# Patient Record
Sex: Female | Born: 1935 | Race: White | Hispanic: No | Marital: Married | Smoking: Never smoker
Health system: Southern US, Community
[De-identification: ages and names within clinical notes are randomized; demographics above are authoritative.]

## PROBLEM LIST (undated history)

## (undated) DIAGNOSIS — E039 Hypothyroidism, unspecified: Secondary | ICD-10-CM

## (undated) DIAGNOSIS — I10 Essential (primary) hypertension: Secondary | ICD-10-CM

## (undated) DIAGNOSIS — R569 Unspecified convulsions: Secondary | ICD-10-CM

## (undated) DIAGNOSIS — F32A Depression, unspecified: Secondary | ICD-10-CM

## (undated) DIAGNOSIS — I2510 Atherosclerotic heart disease of native coronary artery without angina pectoris: Secondary | ICD-10-CM

## (undated) DIAGNOSIS — M1990 Unspecified osteoarthritis, unspecified site: Secondary | ICD-10-CM

## (undated) DIAGNOSIS — F329 Major depressive disorder, single episode, unspecified: Secondary | ICD-10-CM

## (undated) DIAGNOSIS — I639 Cerebral infarction, unspecified: Secondary | ICD-10-CM

## (undated) HISTORY — PX: APPENDECTOMY: SHX54

## (undated) HISTORY — PX: ABDOMINAL HYSTERECTOMY: SHX81

## (undated) HISTORY — PX: CARDIAC SURGERY: SHX584

---

## 2001-06-28 ENCOUNTER — Encounter: Payer: Self-pay | Admitting: Thoracic Surgery (Cardiothoracic Vascular Surgery)

## 2001-06-28 ENCOUNTER — Inpatient Hospital Stay (HOSPITAL_COMMUNITY)
Admission: EM | Admit: 2001-06-28 | Discharge: 2001-07-04 | Payer: Self-pay | Admitting: Thoracic Surgery (Cardiothoracic Vascular Surgery)

## 2001-06-29 ENCOUNTER — Encounter: Payer: Self-pay | Admitting: Thoracic Surgery (Cardiothoracic Vascular Surgery)

## 2001-06-30 ENCOUNTER — Encounter: Payer: Self-pay | Admitting: Thoracic Surgery (Cardiothoracic Vascular Surgery)

## 2001-07-01 ENCOUNTER — Encounter: Payer: Self-pay | Admitting: Thoracic Surgery (Cardiothoracic Vascular Surgery)

## 2001-07-02 ENCOUNTER — Encounter: Payer: Self-pay | Admitting: Thoracic Surgery (Cardiothoracic Vascular Surgery)

## 2001-07-22 ENCOUNTER — Encounter: Payer: Self-pay | Admitting: Thoracic Surgery (Cardiothoracic Vascular Surgery)

## 2001-07-22 ENCOUNTER — Encounter
Admission: RE | Admit: 2001-07-22 | Discharge: 2001-07-22 | Payer: Self-pay | Admitting: Thoracic Surgery (Cardiothoracic Vascular Surgery)

## 2001-08-09 ENCOUNTER — Encounter: Payer: Self-pay | Admitting: Cardiothoracic Surgery

## 2001-08-09 ENCOUNTER — Encounter: Admission: RE | Admit: 2001-08-09 | Discharge: 2001-08-09 | Payer: Self-pay | Admitting: Cardiothoracic Surgery

## 2001-08-19 ENCOUNTER — Encounter
Admission: RE | Admit: 2001-08-19 | Discharge: 2001-08-19 | Payer: Self-pay | Admitting: Thoracic Surgery (Cardiothoracic Vascular Surgery)

## 2001-08-19 ENCOUNTER — Encounter: Payer: Self-pay | Admitting: Thoracic Surgery (Cardiothoracic Vascular Surgery)

## 2001-09-02 ENCOUNTER — Encounter
Admission: RE | Admit: 2001-09-02 | Discharge: 2001-09-02 | Payer: Self-pay | Admitting: Thoracic Surgery (Cardiothoracic Vascular Surgery)

## 2001-09-02 ENCOUNTER — Encounter: Payer: Self-pay | Admitting: Thoracic Surgery (Cardiothoracic Vascular Surgery)

## 2003-11-27 ENCOUNTER — Ambulatory Visit: Payer: Self-pay | Admitting: Family Medicine

## 2004-01-17 HISTORY — PX: CORONARY ARTERY BYPASS GRAFT: SHX141

## 2004-10-16 ENCOUNTER — Emergency Department: Payer: Self-pay | Admitting: Emergency Medicine

## 2005-02-18 ENCOUNTER — Emergency Department: Payer: Self-pay | Admitting: Emergency Medicine

## 2006-03-13 ENCOUNTER — Ambulatory Visit: Payer: Self-pay | Admitting: Family Medicine

## 2006-10-19 ENCOUNTER — Ambulatory Visit: Payer: Self-pay | Admitting: Cardiology

## 2007-06-11 ENCOUNTER — Ambulatory Visit: Payer: Self-pay | Admitting: Family Medicine

## 2007-07-05 ENCOUNTER — Inpatient Hospital Stay: Payer: Self-pay | Admitting: Internal Medicine

## 2007-07-05 ENCOUNTER — Other Ambulatory Visit: Payer: Self-pay

## 2007-07-15 ENCOUNTER — Encounter: Payer: Self-pay | Admitting: Internal Medicine

## 2007-07-23 ENCOUNTER — Encounter: Payer: Self-pay | Admitting: Internal Medicine

## 2007-08-08 ENCOUNTER — Inpatient Hospital Stay: Payer: Self-pay | Admitting: Unknown Physician Specialty

## 2007-08-08 ENCOUNTER — Ambulatory Visit: Payer: Self-pay | Admitting: Internal Medicine

## 2007-08-09 ENCOUNTER — Other Ambulatory Visit: Payer: Self-pay

## 2007-08-17 ENCOUNTER — Encounter: Payer: Self-pay | Admitting: Internal Medicine

## 2010-07-01 ENCOUNTER — Emergency Department: Payer: Self-pay | Admitting: Emergency Medicine

## 2010-08-10 ENCOUNTER — Ambulatory Visit: Payer: Self-pay | Admitting: General Surgery

## 2010-08-26 ENCOUNTER — Emergency Department: Payer: Self-pay | Admitting: *Deleted

## 2010-10-09 ENCOUNTER — Inpatient Hospital Stay: Payer: Self-pay | Admitting: Internal Medicine

## 2010-11-17 ENCOUNTER — Ambulatory Visit: Payer: Self-pay | Admitting: Internal Medicine

## 2010-11-18 ENCOUNTER — Inpatient Hospital Stay: Payer: Self-pay | Admitting: Internal Medicine

## 2010-11-19 DIAGNOSIS — R072 Precordial pain: Secondary | ICD-10-CM

## 2010-11-25 ENCOUNTER — Encounter: Payer: Self-pay | Admitting: Internal Medicine

## 2010-12-17 ENCOUNTER — Encounter: Payer: Self-pay | Admitting: Internal Medicine

## 2010-12-17 ENCOUNTER — Ambulatory Visit: Payer: Self-pay | Admitting: Internal Medicine

## 2011-02-23 ENCOUNTER — Encounter (HOSPITAL_COMMUNITY)
Admission: EM | Disposition: A | Discharge status: Skilled Nursing Facility | Payer: Self-pay | Source: Home / Self Care | Attending: Internal Medicine

## 2011-02-23 ENCOUNTER — Other Ambulatory Visit: Payer: Self-pay

## 2011-02-23 ENCOUNTER — Inpatient Hospital Stay (HOSPITAL_COMMUNITY): Payer: Medicare Other

## 2011-02-23 ENCOUNTER — Encounter (HOSPITAL_COMMUNITY): Payer: Self-pay | Admitting: Anesthesiology

## 2011-02-23 ENCOUNTER — Encounter (HOSPITAL_COMMUNITY): Payer: Self-pay | Admitting: *Deleted

## 2011-02-23 ENCOUNTER — Emergency Department (HOSPITAL_COMMUNITY): Payer: Medicare Other

## 2011-02-23 ENCOUNTER — Inpatient Hospital Stay (HOSPITAL_COMMUNITY)
Admission: EM | Admit: 2011-02-23 | Discharge: 2011-02-28 | DRG: 480 | Disposition: A | Discharge status: Skilled Nursing Facility | Payer: Medicare Other | Attending: Internal Medicine | Admitting: Internal Medicine

## 2011-02-23 ENCOUNTER — Inpatient Hospital Stay (HOSPITAL_COMMUNITY): Payer: Medicare Other | Admitting: Anesthesiology

## 2011-02-23 DIAGNOSIS — D62 Acute posthemorrhagic anemia: Secondary | ICD-10-CM | POA: Diagnosis not present

## 2011-02-23 DIAGNOSIS — IMO0001 Reserved for inherently not codable concepts without codable children: Secondary | ICD-10-CM | POA: Diagnosis present

## 2011-02-23 DIAGNOSIS — E039 Hypothyroidism, unspecified: Secondary | ICD-10-CM | POA: Diagnosis present

## 2011-02-23 DIAGNOSIS — E871 Hypo-osmolality and hyponatremia: Secondary | ICD-10-CM | POA: Diagnosis not present

## 2011-02-23 DIAGNOSIS — W19XXXA Unspecified fall, initial encounter: Secondary | ICD-10-CM | POA: Diagnosis present

## 2011-02-23 DIAGNOSIS — W010XXA Fall on same level from slipping, tripping and stumbling without subsequent striking against object, initial encounter: Secondary | ICD-10-CM | POA: Diagnosis present

## 2011-02-23 DIAGNOSIS — I2510 Atherosclerotic heart disease of native coronary artery without angina pectoris: Secondary | ICD-10-CM

## 2011-02-23 DIAGNOSIS — G40909 Epilepsy, unspecified, not intractable, without status epilepticus: Secondary | ICD-10-CM | POA: Diagnosis present

## 2011-02-23 DIAGNOSIS — Y92009 Unspecified place in unspecified non-institutional (private) residence as the place of occurrence of the external cause: Secondary | ICD-10-CM

## 2011-02-23 DIAGNOSIS — R509 Fever, unspecified: Secondary | ICD-10-CM | POA: Diagnosis present

## 2011-02-23 DIAGNOSIS — J189 Pneumonia, unspecified organism: Secondary | ICD-10-CM | POA: Diagnosis not present

## 2011-02-23 DIAGNOSIS — S7291XA Unspecified fracture of right femur, initial encounter for closed fracture: Secondary | ICD-10-CM

## 2011-02-23 DIAGNOSIS — F329 Major depressive disorder, single episode, unspecified: Secondary | ICD-10-CM | POA: Diagnosis present

## 2011-02-23 DIAGNOSIS — S72143A Displaced intertrochanteric fracture of unspecified femur, initial encounter for closed fracture: Principal | ICD-10-CM | POA: Diagnosis present

## 2011-02-23 DIAGNOSIS — I10 Essential (primary) hypertension: Secondary | ICD-10-CM | POA: Diagnosis present

## 2011-02-23 DIAGNOSIS — S72001A Fracture of unspecified part of neck of right femur, initial encounter for closed fracture: Secondary | ICD-10-CM

## 2011-02-23 DIAGNOSIS — E1165 Type 2 diabetes mellitus with hyperglycemia: Secondary | ICD-10-CM | POA: Diagnosis present

## 2011-02-23 DIAGNOSIS — Z8673 Personal history of transient ischemic attack (TIA), and cerebral infarction without residual deficits: Secondary | ICD-10-CM

## 2011-02-23 DIAGNOSIS — F3289 Other specified depressive episodes: Secondary | ICD-10-CM | POA: Diagnosis present

## 2011-02-23 DIAGNOSIS — E860 Dehydration: Secondary | ICD-10-CM | POA: Diagnosis present

## 2011-02-23 DIAGNOSIS — D72829 Elevated white blood cell count, unspecified: Secondary | ICD-10-CM | POA: Diagnosis present

## 2011-02-23 DIAGNOSIS — Z951 Presence of aortocoronary bypass graft: Secondary | ICD-10-CM

## 2011-02-23 DIAGNOSIS — Z66 Do not resuscitate: Secondary | ICD-10-CM | POA: Diagnosis present

## 2011-02-23 HISTORY — DX: Major depressive disorder, single episode, unspecified: F32.9

## 2011-02-23 HISTORY — DX: Unspecified osteoarthritis, unspecified site: M19.90

## 2011-02-23 HISTORY — DX: Depression, unspecified: F32.A

## 2011-02-23 HISTORY — DX: Essential (primary) hypertension: I10

## 2011-02-23 HISTORY — DX: Atherosclerotic heart disease of native coronary artery without angina pectoris: I25.10

## 2011-02-23 HISTORY — DX: Unspecified convulsions: R56.9

## 2011-02-23 HISTORY — DX: Cerebral infarction, unspecified: I63.9

## 2011-02-23 HISTORY — DX: Hypothyroidism, unspecified: E03.9

## 2011-02-23 HISTORY — PX: FEMUR IM NAIL: SHX1597

## 2011-02-23 LAB — URINALYSIS, ROUTINE W REFLEX MICROSCOPIC
Bilirubin Urine: NEGATIVE
Glucose, UA: >1000 mg/dL — AB
Hgb urine dipstick: NEGATIVE
Ketones, ur: NEGATIVE mg/dL
Specific Gravity, Urine: 1.017 (ref 1.005–1.030)
pH: 6.5 (ref 5.0–8.0)

## 2011-02-23 LAB — COMPREHENSIVE METABOLIC PANEL
ALT: 20 U/L (ref 0–35)
AST: 18 U/L (ref 0–37)
Albumin: 3.5 g/dL (ref 3.5–5.2)
CO2: 25 mEq/L (ref 19–32)
Calcium: 9.3 mg/dL (ref 8.4–10.5)
Chloride: 94 mEq/L — ABNORMAL LOW (ref 96–112)
GFR calc non Af Amer: 83 mL/min — ABNORMAL LOW (ref >90)
Sodium: 131 mEq/L — ABNORMAL LOW (ref 135–145)

## 2011-02-23 LAB — CBC
Platelets: 152 10*3/uL (ref 150–400)
RDW: 15.9 % — ABNORMAL HIGH (ref 11.5–15.5)
WBC: 15 10*3/uL — ABNORMAL HIGH (ref 4.0–10.5)

## 2011-02-23 LAB — DIFFERENTIAL
Basophils Absolute: 0 10*3/uL (ref 0.0–0.1)
Basophils Relative: 0 % (ref 0–1)
Eosinophils Relative: 1 % (ref 0–5)
Lymphocytes Relative: 6 % — ABNORMAL LOW (ref 12–46)
Neutro Abs: 13.2 10*3/uL — ABNORMAL HIGH (ref 1.7–7.7)

## 2011-02-23 LAB — GLUCOSE, CAPILLARY: Glucose-Capillary: 174 mg/dL — ABNORMAL HIGH (ref 70–99)

## 2011-02-23 LAB — PROTIME-INR: Prothrombin Time: 15.2 seconds (ref 11.6–15.2)

## 2011-02-23 LAB — PHENYTOIN LEVEL, TOTAL: Phenytoin Lvl: 15.5 ug/mL (ref 10.0–20.0)

## 2011-02-23 SURGERY — INSERTION, INTRAMEDULLARY ROD, FEMUR
Anesthesia: General | Site: Hip | Laterality: Right | Wound class: Clean

## 2011-02-23 MED ORDER — MENTHOL 3 MG MT LOZG
1.0000 | LOZENGE | OROMUCOSAL | Status: DC | PRN
  Filled 2011-02-23: qty 9

## 2011-02-23 MED ORDER — ISOSORBIDE DINITRATE 30 MG PO TABS
30.0000 mg | ORAL_TABLET | Freq: Four times a day (QID) | ORAL | Status: DC
  Administered 2011-02-23 – 2011-02-26 (×11): 30 mg via ORAL
  Filled 2011-02-23 (×14): qty 1

## 2011-02-23 MED ORDER — PROPOFOL 10 MG/ML IV EMUL
INTRAVENOUS | Status: DC | PRN
  Administered 2011-02-23: 120 mg via INTRAVENOUS

## 2011-02-23 MED ORDER — ONDANSETRON HCL 4 MG/2ML IJ SOLN
INTRAMUSCULAR | Status: DC | PRN
  Administered 2011-02-23: 2 mg via INTRAVENOUS

## 2011-02-23 MED ORDER — AMLODIPINE BESYLATE 2.5 MG PO TABS
2.5000 mg | ORAL_TABLET | Freq: Every day | ORAL | Status: DC
  Administered 2011-02-23 – 2011-02-26 (×4): 2.5 mg via ORAL
  Filled 2011-02-23 (×4): qty 1

## 2011-02-23 MED ORDER — SODIUM CHLORIDE 0.9 % IV SOLN: INTRAVENOUS | Status: DC

## 2011-02-23 MED ORDER — ACETAMINOPHEN 650 MG RE SUPP: 650.0000 mg | Freq: Four times a day (QID) | RECTAL | Status: DC | PRN

## 2011-02-23 MED ORDER — HEPARIN SODIUM (PORCINE) 5000 UNIT/ML IJ SOLN
5000.0000 [IU] | Freq: Three times a day (TID) | INTRAMUSCULAR | Status: DC
  Administered 2011-02-23 – 2011-02-26 (×9): 5000 [IU] via SUBCUTANEOUS
  Filled 2011-02-23 (×14): qty 1

## 2011-02-23 MED ORDER — HYDROMORPHONE HCL PF 1 MG/ML IJ SOLN
1.0000 mg | INTRAMUSCULAR | Status: DC | PRN
  Administered 2011-02-23 (×2): 1 mg via INTRAVENOUS

## 2011-02-23 MED ORDER — FENTANYL CITRATE 0.05 MG/ML IJ SOLN
INTRAMUSCULAR | Status: AC
  Filled 2011-02-23: qty 2

## 2011-02-23 MED ORDER — CARVEDILOL 12.5 MG PO TABS
12.5000 mg | ORAL_TABLET | Freq: Two times a day (BID) | ORAL | Status: DC
  Administered 2011-02-24 – 2011-02-28 (×10): 12.5 mg via ORAL
  Filled 2011-02-23 (×13): qty 1

## 2011-02-23 MED ORDER — INSULIN ASPART 100 UNIT/ML ~~LOC~~ SOLN
0.0000 [IU] | Freq: Three times a day (TID) | SUBCUTANEOUS | Status: DC
  Administered 2011-02-24: 3 [IU] via SUBCUTANEOUS
  Administered 2011-02-24 – 2011-02-25 (×3): 5 [IU] via SUBCUTANEOUS
  Administered 2011-02-25 (×2): 8 [IU] via SUBCUTANEOUS
  Administered 2011-02-26 (×3): 5 [IU] via SUBCUTANEOUS
  Administered 2011-02-27 – 2011-02-28 (×3): 3 [IU] via SUBCUTANEOUS
  Administered 2011-02-28: 8 [IU] via SUBCUTANEOUS
  Administered 2011-02-28: 3 [IU] via SUBCUTANEOUS
  Filled 2011-02-23: qty 3

## 2011-02-23 MED ORDER — HYDROCODONE-ACETAMINOPHEN 5-325 MG PO TABS
1.0000 | ORAL_TABLET | ORAL | Status: DC | PRN
  Administered 2011-02-24: 1 via ORAL
  Filled 2011-02-23: qty 1

## 2011-02-23 MED ORDER — LACTATED RINGERS IV SOLN
INTRAVENOUS | Status: DC
  Administered 2011-02-25: 16:00:00 via INTRAVENOUS

## 2011-02-23 MED ORDER — CEFAZOLIN SODIUM 1-5 GM-% IV SOLN
INTRAVENOUS | Status: DC | PRN
  Administered 2011-02-23: 1 g via INTRAVENOUS

## 2011-02-23 MED ORDER — LACTATED RINGERS IV SOLN
INTRAVENOUS | Status: DC | PRN
  Administered 2011-02-23: 19:00:00 via INTRAVENOUS

## 2011-02-23 MED ORDER — ONDANSETRON HCL 4 MG/2ML IJ SOLN: 4.0000 mg | Freq: Four times a day (QID) | INTRAMUSCULAR | Status: DC | PRN

## 2011-02-23 MED ORDER — FENTANYL CITRATE 0.05 MG/ML IJ SOLN
50.0000 ug | Freq: Once | INTRAMUSCULAR | Status: AC
  Administered 2011-02-23: 50 ug via INTRAVENOUS
  Filled 2011-02-23: qty 2

## 2011-02-23 MED ORDER — GLYCOPYRROLATE 0.2 MG/ML IJ SOLN
INTRAMUSCULAR | Status: DC | PRN
  Administered 2011-02-23: .4 mg via INTRAVENOUS

## 2011-02-23 MED ORDER — HETASTARCH-ELECTROLYTES 6 % IV SOLN
INTRAVENOUS | Status: DC | PRN
  Administered 2011-02-23: 20:00:00 via INTRAVENOUS

## 2011-02-23 MED ORDER — EPHEDRINE SULFATE 50 MG/ML IJ SOLN
INTRAMUSCULAR | Status: DC | PRN
  Administered 2011-02-23: 10 mg via INTRAVENOUS
  Administered 2011-02-23: 5 mg via INTRAVENOUS

## 2011-02-23 MED ORDER — CEFAZOLIN SODIUM 1-5 GM-% IV SOLN
1.0000 g | Freq: Three times a day (TID) | INTRAVENOUS | Status: AC
  Administered 2011-02-24 (×3): 1 g via INTRAVENOUS
  Filled 2011-02-23 (×3): qty 50

## 2011-02-23 MED ORDER — NEOSTIGMINE METHYLSULFATE 1 MG/ML IJ SOLN
INTRAMUSCULAR | Status: DC | PRN
  Administered 2011-02-23: 2 mg via INTRAVENOUS

## 2011-02-23 MED ORDER — PROMETHAZINE HCL 25 MG/ML IJ SOLN: 6.2500 mg | INTRAMUSCULAR | Status: DC | PRN

## 2011-02-23 MED ORDER — ONDANSETRON HCL 4 MG/2ML IJ SOLN: 4.0000 mg | Freq: Three times a day (TID) | INTRAMUSCULAR | Status: DC | PRN

## 2011-02-23 MED ORDER — ONDANSETRON HCL 4 MG PO TABS: 4.0000 mg | ORAL_TABLET | Freq: Four times a day (QID) | ORAL | Status: DC | PRN

## 2011-02-23 MED ORDER — METHOCARBAMOL 100 MG/ML IJ SOLN
500.0000 mg | Freq: Four times a day (QID) | INTRAVENOUS | Status: DC | PRN
  Administered 2011-02-23: 500 mg via INTRAVENOUS
  Filled 2011-02-23 (×2): qty 5

## 2011-02-23 MED ORDER — SODIUM CHLORIDE 0.9 % IV SOLN
INTRAVENOUS | Status: AC
  Administered 2011-02-23: 75 mL/h via INTRAVENOUS

## 2011-02-23 MED ORDER — ONDANSETRON HCL 4 MG/2ML IJ SOLN
INTRAMUSCULAR | Status: AC
  Filled 2011-02-23: qty 2

## 2011-02-23 MED ORDER — PHENYTOIN SODIUM EXTENDED 100 MG PO CAPS
100.0000 mg | ORAL_CAPSULE | Freq: Three times a day (TID) | ORAL | Status: DC
  Administered 2011-02-23 – 2011-02-28 (×15): 100 mg via ORAL
  Filled 2011-02-23 (×19): qty 1

## 2011-02-23 MED ORDER — INSULIN GLARGINE 100 UNIT/ML ~~LOC~~ SOLN
20.0000 [IU] | Freq: Every day | SUBCUTANEOUS | Status: DC
  Administered 2011-02-23 – 2011-02-24 (×2): 20 [IU] via SUBCUTANEOUS
  Filled 2011-02-23: qty 3

## 2011-02-23 MED ORDER — CITALOPRAM HYDROBROMIDE 20 MG PO TABS
20.0000 mg | ORAL_TABLET | Freq: Every day | ORAL | Status: DC
  Administered 2011-02-24 – 2011-02-28 (×5): 20 mg via ORAL
  Filled 2011-02-23 (×7): qty 1

## 2011-02-23 MED ORDER — CISATRACURIUM BOLUS VIA INFUSION
INTRAVENOUS | Status: DC | PRN
  Administered 2011-02-23: 4 mg via INTRAVENOUS

## 2011-02-23 MED ORDER — ACETAMINOPHEN 325 MG PO TABS
650.0000 mg | ORAL_TABLET | Freq: Four times a day (QID) | ORAL | Status: DC | PRN
  Administered 2011-02-26 – 2011-02-28 (×5): 650 mg via ORAL
  Filled 2011-02-23 (×6): qty 2

## 2011-02-23 MED ORDER — SODIUM CHLORIDE 0.9 % IV SOLN
999.0000 mL | INTRAVENOUS | Status: DC
  Administered 2011-02-23: 999 mL via INTRAVENOUS
  Administered 2011-02-25: 1000 mL via INTRAVENOUS

## 2011-02-23 MED ORDER — SODIUM CHLORIDE 0.9 % IV SOLN
INTRAVENOUS | Status: DC | PRN
  Administered 2011-02-23 (×2): via INTRAVENOUS

## 2011-02-23 MED ORDER — METHOCARBAMOL 500 MG PO TABS
500.0000 mg | ORAL_TABLET | Freq: Four times a day (QID) | ORAL | Status: DC | PRN
  Administered 2011-02-26 – 2011-02-28 (×5): 500 mg via ORAL
  Filled 2011-02-23 (×5): qty 1

## 2011-02-23 MED ORDER — METOCLOPRAMIDE HCL 5 MG/ML IJ SOLN: 5.0000 mg | Freq: Three times a day (TID) | INTRAMUSCULAR | Status: DC | PRN

## 2011-02-23 MED ORDER — WARFARIN VIDEO
Freq: Once | Status: AC
  Administered 2011-02-24: 17:00:00

## 2011-02-23 MED ORDER — WARFARIN SODIUM 4 MG PO TABS
4.0000 mg | ORAL_TABLET | ORAL | Status: AC
  Administered 2011-02-23: 4 mg via ORAL
  Filled 2011-02-23: qty 1

## 2011-02-23 MED ORDER — BRIMONIDINE TARTRATE 0.15 % OP SOLN
1.0000 [drp] | Freq: Two times a day (BID) | OPHTHALMIC | Status: DC
  Administered 2011-02-23 – 2011-02-28 (×10): 1 [drp] via OPHTHALMIC
  Filled 2011-02-23: qty 5

## 2011-02-23 MED ORDER — PANTOPRAZOLE SODIUM 40 MG PO TBEC
80.0000 mg | DELAYED_RELEASE_TABLET | Freq: Every day | ORAL | Status: DC
  Administered 2011-02-24 – 2011-02-28 (×5): 80 mg via ORAL
  Filled 2011-02-23 (×6): qty 2
  Filled 2011-02-23 (×2): qty 1

## 2011-02-23 MED ORDER — SUCCINYLCHOLINE CHLORIDE 20 MG/ML IJ SOLN
INTRAMUSCULAR | Status: DC | PRN
  Administered 2011-02-23: 100 mg via INTRAVENOUS

## 2011-02-23 MED ORDER — PHENOL 1.4 % MT LIQD
1.0000 | OROMUCOSAL | Status: DC | PRN
  Filled 2011-02-23: qty 177

## 2011-02-23 MED ORDER — METOCLOPRAMIDE HCL 10 MG PO TABS: 5.0000 mg | ORAL_TABLET | Freq: Three times a day (TID) | ORAL | Status: DC | PRN

## 2011-02-23 MED ORDER — HYDROMORPHONE HCL PF 1 MG/ML IJ SOLN
INTRAMUSCULAR | Status: AC
  Administered 2011-02-23: 1 mg via INTRAVENOUS
  Filled 2011-02-23: qty 1

## 2011-02-23 MED ORDER — FENTANYL CITRATE 0.05 MG/ML IJ SOLN: 50.0000 ug | INTRAMUSCULAR | Status: DC | PRN

## 2011-02-23 MED ORDER — MEPERIDINE HCL 50 MG/ML IJ SOLN: 6.2500 mg | INTRAMUSCULAR | Status: DC | PRN

## 2011-02-23 MED ORDER — SODIUM CHLORIDE 0.9 % IV SOLN: 999.0000 mL | INTRAVENOUS | Status: DC

## 2011-02-23 MED ORDER — LIDOCAINE HCL (CARDIAC) 20 MG/ML IV SOLN
INTRAVENOUS | Status: DC | PRN
  Administered 2011-02-23: 40 mg via INTRAVENOUS

## 2011-02-23 MED ORDER — FENTANYL CITRATE 0.05 MG/ML IJ SOLN
INTRAMUSCULAR | Status: DC | PRN
  Administered 2011-02-23 (×2): 25 ug via INTRAVENOUS
  Administered 2011-02-23 (×3): 50 ug via INTRAVENOUS

## 2011-02-23 MED ORDER — FENTANYL CITRATE 0.05 MG/ML IJ SOLN
50.0000 ug | Freq: Once | INTRAMUSCULAR | Status: AC
  Administered 2011-02-23: 50 ug via INTRAVENOUS

## 2011-02-23 MED ORDER — LEVOTHYROXINE SODIUM 50 MCG PO TABS
50.0000 ug | ORAL_TABLET | Freq: Every day | ORAL | Status: DC
  Administered 2011-02-24 – 2011-02-28 (×5): 50 ug via ORAL
  Filled 2011-02-23 (×6): qty 1

## 2011-02-23 MED ORDER — COUMADIN BOOK
Freq: Once | Status: AC
  Administered 2011-02-24: 16:00:00
  Filled 2011-02-23 (×2): qty 1

## 2011-02-23 MED ORDER — HYDROMORPHONE HCL PF 1 MG/ML IJ SOLN
0.2500 mg | INTRAMUSCULAR | Status: DC | PRN
  Filled 2011-02-23: qty 1

## 2011-02-23 MED ORDER — 0.9 % SODIUM CHLORIDE (POUR BTL) OPTIME
TOPICAL | Status: DC | PRN
  Administered 2011-02-23: 1000 mL

## 2011-02-23 MED ORDER — BRIMONIDINE TARTRATE 0.1 % OP SOLN: 1.0000 [drp] | Freq: Two times a day (BID) | OPHTHALMIC | Status: DC

## 2011-02-23 MED ORDER — INSULIN ASPART 100 UNIT/ML ~~LOC~~ SOLN
0.0000 [IU] | Freq: Every day | SUBCUTANEOUS | Status: DC
  Administered 2011-02-24: 5 [IU] via SUBCUTANEOUS
  Administered 2011-02-25: 4 [IU] via SUBCUTANEOUS

## 2011-02-23 MED ORDER — SENNOSIDES-DOCUSATE SODIUM 8.6-50 MG PO TABS
1.0000 | ORAL_TABLET | Freq: Every evening | ORAL | Status: DC | PRN
  Administered 2011-02-26: 1 via ORAL
  Filled 2011-02-23: qty 1

## 2011-02-23 MED ORDER — ONDANSETRON HCL 4 MG/2ML IJ SOLN
4.0000 mg | Freq: Once | INTRAMUSCULAR | Status: AC
  Administered 2011-02-23: 4 mg via INTRAVENOUS

## 2011-02-23 SURGICAL SUPPLY — 44 items
BAG ZIPLOCK 12X15 (MISCELLANEOUS) IMPLANT
BANDAGE ELASTIC 6 VELCRO ST LF (GAUZE/BANDAGES/DRESSINGS) IMPLANT
BANDAGE GAUZE ELAST BULKY 4 IN (GAUZE/BANDAGES/DRESSINGS) ×2 IMPLANT
BLADE SURG 15 STRL LF DISP TIS (BLADE) ×1 IMPLANT
BLADE SURG 15 STRL SS (BLADE) ×1
BNDG COHESIVE 4X5 TAN STRL (GAUZE/BANDAGES/DRESSINGS) ×2 IMPLANT
CLOTH BEACON ORANGE TIMEOUT ST (SAFETY) ×2 IMPLANT
DRAPE STERI IOBAN 125X83 (DRAPES) ×2 IMPLANT
DRSG MEPILEX BORDER 4X4 (GAUZE/BANDAGES/DRESSINGS) ×6 IMPLANT
DRSG PAD ABDOMINAL 8X10 ST (GAUZE/BANDAGES/DRESSINGS) ×4 IMPLANT
DRSG TEGADERM 4X4.75 (GAUZE/BANDAGES/DRESSINGS) ×2 IMPLANT
DURAPREP 26ML APPLICATOR (WOUND CARE) ×2 IMPLANT
ELECT REM PT RETURN 9FT ADLT (ELECTROSURGICAL) ×2
ELECTRODE REM PT RTRN 9FT ADLT (ELECTROSURGICAL) ×1 IMPLANT
GAUZE XEROFORM 1X8 LF (GAUZE/BANDAGES/DRESSINGS) ×2 IMPLANT
GLOVE BIOGEL PI IND STRL 6 (GLOVE) ×1 IMPLANT
GLOVE BIOGEL PI INDICATOR 6 (GLOVE) ×1
GLOVE SURG ORTHO 8.0 STRL STRW (GLOVE) ×2 IMPLANT
GLOVE SURG SS PI 6.5 STRL IVOR (GLOVE) ×2 IMPLANT
GOWN STRL NON-REIN LRG LVL3 (GOWN DISPOSABLE) ×2 IMPLANT
GUIDE PIN 3.2 LONG (PIN) ×2 IMPLANT
GUIDE PIN 3.2MM (MISCELLANEOUS) ×1
GUIDE PIN ORTH 343X3.2XBRAD (MISCELLANEOUS) ×1 IMPLANT
HIP SCREW SET (Screw) ×2 IMPLANT
KIT BASIN OR (CUSTOM PROCEDURE TRAY) ×2 IMPLANT
NAIL IMHS 10X36 R (Nail) ×2 IMPLANT
NS IRRIG 1000ML POUR BTL (IV SOLUTION) ×2 IMPLANT
PACK GENERAL/GYN (CUSTOM PROCEDURE TRAY) ×2 IMPLANT
PADDING CAST COTTON 6X4 STRL (CAST SUPPLIES) ×4 IMPLANT
POSITIONER SURGICAL ARM (MISCELLANEOUS) ×2 IMPLANT
SCREW COMPRESSION (Screw) ×2 IMPLANT
SCREW LAG 95MM (Screw) ×1 IMPLANT
SCREW LAGSTD 95X21X12.7X9 (Screw) ×1 IMPLANT
SPONGE GAUZE 4X4 12PLY (GAUZE/BANDAGES/DRESSINGS) IMPLANT
SPONGE LAP 4X18 X RAY DECT (DISPOSABLE) IMPLANT
STAPLER VISISTAT (STAPLE) IMPLANT
STAPLER VISISTAT 35W (STAPLE) ×2 IMPLANT
SUT ETHILON 3 0 PS 1 (SUTURE) IMPLANT
SUT VIC AB 0 CT1 27 (SUTURE) ×2
SUT VIC AB 0 CT1 27XBRD ANTBC (SUTURE) ×2 IMPLANT
SUT VIC AB 2-0 CT1 27 (SUTURE) ×1
SUT VIC AB 2-0 CT1 TAPERPNT 27 (SUTURE) ×1 IMPLANT
TOWEL OR 17X26 10 PK STRL BLUE (TOWEL DISPOSABLE) ×6 IMPLANT
WATER STERILE IRR 1500ML POUR (IV SOLUTION) ×2 IMPLANT

## 2011-02-23 NOTE — ED Notes (Signed)
Patient transported to X-ray 

## 2011-02-23 NOTE — Brief Op Note (Signed)
02/23/2011  8:30 PM  PATIENT:  Courtney Pitts  78 y.o. female  PRE-OPERATIVE DIAGNOSIS:  Right intertrochantaric fracture  POST-OPERATIVE DIAGNOSIS:  right intertrochantaric fracture  PROCEDURE:  Procedure(s): INTRAMEDULLARY (IM) NAIL FEMORAL  SURGEON:  Surgeon(s): Cammy Copa, MD  ASSISTANT: none  ANESTHESIA:   general  EBL: 100 ml    Total I/O In: -  Out: 250 [Urine:50; Blood:200]  BLOOD ADMINISTERED: none  DRAINS: none   LOCAL MEDICATIONS USED:  none  SPECIMEN:  No Specimen  COUNTS:  YES  TOURNIQUET:  * No tourniquets in log *  DICTATION: .Other Dictation: Dictation Number [id number]  PLAN OF CARE: Admit to inpatient   PATIENT DISPOSITION:  PACU - hemodynamically stable

## 2011-02-23 NOTE — Anesthesia Postprocedure Evaluation (Signed)
  Anesthesia Post-op Note  Patient: Courtney Pitts  Procedure(s) Performed:  INTRAMEDULLARY (IM) NAIL FEMORAL  Patient Location: PACU  Anesthesia Type: General  Level of Consciousness: awake and alert   Airway and Oxygen Therapy: Patient Spontanous Breathing  Post-op Pain: mild  Post-op Assessment: Post-op Vital signs reviewed, Patient's Cardiovascular Status Stable, Respiratory Function Stable, Patent Airway and No signs of Nausea or vomiting  Post-op Vital Signs: stable  Complications: No apparent anesthesia complications

## 2011-02-23 NOTE — ED Notes (Signed)
Attempted to call report.  Nurse unavailable. 

## 2011-02-23 NOTE — ED Notes (Signed)
Attempted to call report again.  Nurse unavailable.  Asked for the charge nurse and was told she had gone home.

## 2011-02-23 NOTE — H&P (Signed)
Courtney Pitts is an 78 y.o. female.   Chief Complaint: Right hip pain HPI: Courtney Pitts is an 78 year old female who fell today while doing chores in the kitchen. She states she became a little dizzy and fell and hit the floor. She denies any frank syncope and denies any other orthopedic complaints other than right elbow pain and right hip pain. She was not able to weight-bear on the right side following the fall. 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  Past Medical History  Diagnosis Date  . Hypertension   . Diabetes mellitus   . Hypothyroid   . Stroke   . Seizures   . Arthritis   . Coronary artery disease   . Depression     Past Surgical History  Procedure Date  . Abdominal hysterectomy   . Appendectomy   . Coronary artery bypass graft 2006    Triple Bipass @     History reviewed. No pertinent family history. Social History:  reports that she has never smoked. She quit smokeless tobacco use about 3 months ago. She reports that she does not drink alcohol or use illicit drugs.  Allergies:  Allergies  Allergen Reactions  . Aspirin Other (See Comments)    dirrahea  . Sulfa Antibiotics Nausea And Vomiting    Medications Prior to Admission  Medication Dose Route Frequency Provider Last Rate Last Dose  . 0.9 %  sodium chloride infusion   Intravenous STAT Flint Melter, MD      . 0.9 %  sodium chloride infusion  999 mL Intravenous Continuous Baltazar Najjar, MD 50 mL/hr at 02/23/11 1658 999 mL at 02/23/11 1658  . amLODipine (NORVASC) tablet 2.5 mg  2.5 mg Oral Daily Baltazar Najjar, MD      . brimonidine (ALPHAGAN) 0.15 % ophthalmic solution 1 drop  1 drop Both Eyes BID Kathlen Mody, MD      . carvedilol (COREG) tablet 12.5 mg  12.5 mg Oral BID WC Baltazar Najjar, MD      . citalopram (CELEXA) tablet 20 mg  20 mg Oral Daily Baltazar Najjar, MD      . fentaNYL (SUBLIMAZE) 0.05 MG/ML injection           . fentaNYL  (SUBLIMAZE) injection 50 mcg  50 mcg Intravenous Once Flint Melter, MD   50 mcg at 02/23/11 1147  . fentaNYL (SUBLIMAZE) injection 50 mcg  50 mcg Intravenous Once Flint Melter, MD   50 mcg at 02/23/11 1344  . fentaNYL (SUBLIMAZE) injection 50 mcg  50 mcg Intravenous Q1H PRN Flint Melter, MD      . heparin injection 5,000 Units  5,000 Units Subcutaneous Q8H Baltazar Najjar, MD      . HYDROmorphone (DILAUDID) 1 MG/ML injection        1 mg at 02/23/11 1644  . HYDROmorphone (DILAUDID) injection 1 mg  1 mg Intravenous Q4H PRN Baltazar Najjar, MD      . insulin aspart (novoLOG) injection 0-15 Units  0-15 Units Subcutaneous TID WC Baltazar Najjar, MD      . insulin aspart (novoLOG) injection 0-5 Units  0-5 Units Subcutaneous QHS Baltazar Najjar, MD      . insulin glargine (LANTUS) injection 20 Units  20 Units Subcutaneous QHS Baltazar Najjar, MD      . isosorbide dinitrate (ISORDIL) tablet 30 mg  30 mg Oral QID Baltazar Najjar, MD      . levothyroxine (  SYNTHROID, LEVOTHROID) tablet 50 mcg  50 mcg Oral QAC breakfast Baltazar Najjar, MD      . ondansetron [facility]) 4 MG/2ML injection           . ondansetron (ZOFRAN) injection 4 mg  4 mg Intravenous Once Flint Melter, MD   4 mg at 02/23/11 1146  . ondansetron (ZOFRAN) injection 4 mg  4 mg Intravenous Q8H PRN Flint Melter, MD      . ondansetron [facility]) tablet 4 mg  4 mg Oral Q6H PRN Baltazar Najjar, MD       Or  . ondansetron (ZOFRAN) injection 4 mg  4 mg Intravenous Q6H PRN Baltazar Najjar, MD      . pantoprazole (PROTONIX) EC tablet 80 mg  80 mg Oral Q1200 Baltazar Najjar, MD      . phenytoin (DILANTIN) ER capsule 100 mg  100 mg Oral TID Baltazar Najjar, MD      . senna-docusate (Senokot-S) tablet 1 tablet  1 tablet Oral QHS PRN Baltazar Najjar, MD      . DISCONTD: 0.9 %  sodium chloride infusion  999 mL Intravenous Continuous Flint Melter, MD      . DISCONTD: 0.9 %  sodium chloride infusion   Intravenous Continuous Baltazar Najjar, MD      .  DISCONTD: brimonidine (ALPHAGAN P) 0.1 % ophthalmic solution 1 drop  1 drop Both Eyes BID Baltazar Najjar, MD       No current outpatient prescriptions on file as of 02/23/2011.    Results for orders placed during the hospital encounter of 02/23/11 (from the past 48 hour(s))  CBC     Status: Abnormal   Collection Time   02/23/11 11:30 AM      Component Value Range Comment   WBC 15.0 (*) 4.0 - 10.5 (K/uL)    RBC 3.97  3.87 - 5.11 (MIL/uL)    Hemoglobin 11.0 (*) 12.0 - 15.0 (g/dL)    HCT 16.1 (*) 09.6 - 46.0 (%)    MCV 82.6  78.0 - 100.0 (fL)    MCH 27.7  26.0 - 34.0 (pg)    MCHC 33.5  30.0 - 36.0 (g/dL)    RDW 04.5 (*) 40.9 - 15.5 (%)    Platelets 152  150 - 400 (K/uL)   DIFFERENTIAL     Status: Abnormal   Collection Time   02/23/11 11:30 AM      Component Value Range Comment   Neutrophils Relative 88 (*) 43 - 77 (%)    Neutro Abs 13.2 (*) 1.7 - 7.7 (K/uL)    Lymphocytes Relative 6 (*) 12 - 46 (%)    Lymphs Abs 0.8  0.7 - 4.0 (K/uL)    Monocytes Relative 6  3 - 12 (%)    Monocytes Absolute 0.8  0.1 - 1.0 (K/uL)    Eosinophils Relative 1  0 - 5 (%)    Eosinophils Absolute 0.1  0.0 - 0.7 (K/uL)    Basophils Relative 0  0 - 1 (%)    Basophils Absolute 0.0  0.0 - 0.1 (K/uL)   COMPREHENSIVE METABOLIC PANEL     Status: Abnormal   Collection Time   02/23/11 11:30 AM      Component Value Range Comment   Sodium 131 (*) 135 - 145 (mEq/L)    Potassium 4.7  3.5 - 5.1 (mEq/L)    Chloride 94 (*) 96 - 112 (mEq/L)    CO2 25  19 - 32 (mEq/L)  Glucose, Bld 229 (*) 70 - 99 (mg/dL)    BUN 26 (*) 6 - 23 (mg/dL)    Creatinine, Ser 4.09  0.50 - 1.10 (mg/dL)    Calcium 9.3  8.4 - 10.5 (mg/dL)    Total Protein 6.6  6.0 - 8.3 (g/dL)    Albumin 3.5  3.5 - 5.2 (g/dL)    AST 18  0 - 37 (U/L)    ALT 20  0 - 35 (U/L)    Alkaline Phosphatase 109  39 - 117 (U/L)    Total Bilirubin 0.2 (*) 0.3 - 1.2 (mg/dL)    GFR calc non Af Amer 83 (*) >90 (mL/min)    GFR calc Af Amer >90  >90 (mL/min)   TYPE AND SCREEN      Status: Normal   Collection Time   02/23/11 11:30 AM      Component Value Range Comment   ABO/RH(D) A POS      Antibody Screen NEG      Sample Expiration 02/26/2011     PROTIME-INR     Status: Normal   Collection Time   02/23/11 11:30 AM      Component Value Range Comment   Prothrombin Time 15.2  11.6 - 15.2 (seconds)    INR 1.18  0.00 - 1.49    ABO/RH     Status: Normal   Collection Time   02/23/11 11:30 AM      Component Value Range Comment   ABO/RH(D) A POS     URINALYSIS, ROUTINE W REFLEX MICROSCOPIC     Status: Abnormal   Collection Time   02/23/11 12:18 PM      Component Value Range Comment   Color, Urine YELLOW  YELLOW     APPearance CLEAR  CLEAR     Specific Gravity, Urine 1.017  1.005 - 1.030     pH 6.5  5.0 - 8.0     Glucose, UA >1000 (*) NEGATIVE (mg/dL)    Hgb urine dipstick NEGATIVE  NEGATIVE     Bilirubin Urine NEGATIVE  NEGATIVE     Ketones, ur NEGATIVE  NEGATIVE (mg/dL)    Protein, ur NEGATIVE  NEGATIVE (mg/dL)    Urobilinogen, UA 0.2  0.0 - 1.0 (mg/dL)    Nitrite NEGATIVE  NEGATIVE     Leukocytes, UA NEGATIVE  NEGATIVE    URINE MICROSCOPIC-ADD ON     Status: Normal   Collection Time   02/23/11 12:18 PM      Component Value Range Comment   Urine-Other        Value: NO FORMED ELEMENTS SEEN ON URINE MICROSCOPIC EXAMINATION   Dg Chest 1 View  02/23/2011  *RADIOLOGY REPORT*  Clinical Data: Hip fracture, fall, weakness  CHEST - 1 VIEW  Comparison: None  Findings: Borderline cardiomegaly noted.  Status post median sternotomy. No pulmonary edema.  No diagnostic pneumothorax.  Right midlung linear atelectasis or infiltrate.  IMPRESSION: No pulmonary edema.  No diagnostic pneumothorax.  Cardiomegaly. Right midlung linear atelectasis or infiltrate.  Original Report Authenticated By: Natasha Mead, M.D.   Dg Hip Complete Right  02/23/2011  *RADIOLOGY REPORT*  Clinical Data: Severe right hip pain status post fall today.  RIGHT HIP - COMPLETE 2+ VIEW  Comparison: None.  Findings:  There is an acute intertrochanteric right femur fracture with resulting moderate varus angulation.  No dislocation or acute pelvic fracture is seen.  The patient is status post pinning of a subcapital fracture of the left femoral neck.  There  is generalized osteopenia.  Scattered vascular calcifications are noted.  IMPRESSION: Angulated and mildly comminuted intertrochanteric right femur fracture.  Per CMS PQRS reporting requirements (PQRS Measure 24): Given the patient's age of greater than 50 and the fracture site (hip, distal radius, or spine), the patient should be tested for osteoporosis using DXA, and the appropriate treatment considered based on the DXA results.  Original Report Authenticated By: Gerrianne Scale, M.D.    Review of Systems  Constitutional: Negative.   HENT: Negative.   Eyes: Negative.   Respiratory: Positive for cough.   Cardiovascular: Negative.   Gastrointestinal: Negative.   Genitourinary: Positive for dysuria.  Musculoskeletal: Positive for joint pain.  Skin: Negative.   Neurological: Negative.   Endo/Heme/Allergies: Negative.   Psychiatric/Behavioral: Negative.     Blood pressure 164/70, pulse 70, temperature 99 F (37.2 C), temperature source Oral, resp. rate 18, height 5\' 6"  (1.676 m), weight 49.896 kg (110 lb), SpO2 97.00%. Physical Exam  Constitutional: She appears well-developed and well-nourished.  HENT:  Head: Normocephalic and atraumatic.  Eyes: Conjunctivae and EOM are normal. Pupils are equal, round, and reactive to light.  Neck: Normal range of motion. Neck supple.  Cardiovascular: Normal rate.   Respiratory: Effort normal.  GI: Soft.   the patient has good dorsiflexion plantarflexion strength bilateral lower extremities pedal pulses palpable bilaterally. She has no left-sided knee pain with range of motion or hip pain with range of motion. She has no right knee effusion. She does have bruising over the right hip area. She has bilateral left upper  chimney full active and passive range of motion wrist elbow and shoulder. On the right-hand side she has an abrasion over the right elbow but good range of motion which is painless wrist range of motion also intact on the right-hand side along with shoulder range of motion. She has intact sensation on the dorsum plantar aspect of both feet.  Assessment/Plan Impression is right hip intertrochanteric fracture in a patient who has multiple medical comorbidities including history of coronary disease, history of seizure disorder with 4 year history of Dilantin use which may give her an increased risk of osteoporosis, history of carotid artery stenosis 50% occlusion on one side per family report., As well as hypertension and diabetes. The patient is at moderate risk for operative intervention; however, because of her recent history of pneumonia it is imperative that in order for her to have the best chance of survival that fixation be performed in or to facilitate activity. Her laboratory values are simply normal except for increased blood glucose and increased white blood cell count consistent with margination. The patient does have on chest x-ray evidence consistent with previous pneumonia which appears to be resolving. Risk and benefits of surgery including but not limited to infection nerve vessel damage incomplete healing need for more surgery deep vein thrombosis and death were all reviewed with the patient and her family. All questions are answered.  DEAN,GREGORY SCOTT 02/23/2011, 6:01 PM

## 2011-02-23 NOTE — ED Provider Notes (Signed)
History     CSN: [id number]  Arrival date & time 02/23/11  1032   First MD Initiated Contact with Patient 02/23/11 1049      Chief Complaint  Patient presents with  . Fall    hip pain    (Consider location/radiation/quality/duration/timing/severity/associated sxs/prior treatment) HPI Courtney Pitts is a 78 y.o. female was at home, when she fell in her kitchen. She does not not why she fell; she remembers wearing socks prior to the fall. She was unable to get up due to right hip pain. She has had previous falls without clear cause, and has sustained a humerus fracture. She has not been ill recently. History is taken from her son, who is with her. She cannot give recent or remote history.  Level V Caveat-dementia        Past Medical History  Diagnosis Date  . Hypertension   . Diabetes mellitus   . Hypothyroid   . Stroke   . Seizures   . Arthritis   . Coronary artery disease   . Depression     Past Surgical History  Procedure Date  . Abdominal hysterectomy   . Appendectomy   . Coronary artery bypass graft 2006    Triple Bipass @ [place]    History reviewed. No pertinent family history.  History  Substance Use Topics  . Smoking status: Never Smoker   . Smokeless tobacco: Former Neurosurgeon    Quit date: 11/23/2010  . Alcohol Use: No    OB History    Grav Para Term Preterm Abortions TAB SAB Ect Mult Living                  Review of Systems  All other systems reviewed and are negative.    Allergies  Aspirin and Sulfa antibiotics  Home Medications   No current outpatient prescriptions on file.  BP 164/70  Pulse 70  Temp(Src) 99 F (37.2 C) (Oral)  Resp 18  Ht 5\' 6"  (1.676 m)  Wt 110 lb (49.896 kg)  BMI 17.75 kg/m2  SpO2 97%  Physical Exam  Nursing note and vitals reviewed. Constitutional: She appears well-developed and well-nourished.  HENT:  Head: Normocephalic and atraumatic.       No scalp lesion  Eyes: Conjunctivae and EOM are  normal. Pupils are equal, round, and reactive to light.  Neck: Normal range of motion and phonation normal. Neck supple. No tracheal deviation present.       She exhibits normal range of motion of the neck during exam  Cardiovascular: Normal rate, regular rhythm and intact distal pulses.   Pulmonary/Chest: Effort normal and breath sounds normal. She exhibits no tenderness.       No rib tenderness or deformity  Abdominal: Soft. She exhibits no distension. There is no tenderness. There is no guarding.  Musculoskeletal:       Right hip is foreshortened, externally rotated and tender to palpation.  Neurological: She is alert. She has normal strength. She exhibits normal muscle tone.  Skin: Skin is warm and dry.  Psychiatric: She has a normal mood and affect. Her behavior is normal.    ED Course  Procedures (including critical care time)  Date: 02/23/2011  Rate: 75  Rhythm: normal sinus rhythm  QRS Axis: normal  Intervals: normal  ST/T Wave abnormalities: early repolarization  Conduction Disutrbances:first-degree A-V block   Narrative Interpretation: poor R wave progression, less T wave inversion laterally  Old EKG Reviewed: changes noted    Labs  Reviewed  CBC - Abnormal; Notable for the following:    WBC 15.0 (*)    Hemoglobin 11.0 (*)    HCT 32.8 (*)    RDW 15.9 (*)    All other components within normal limits  DIFFERENTIAL - Abnormal; Notable for the following:    Neutrophils Relative 88 (*)    Neutro Abs 13.2 (*)    Lymphocytes Relative 6 (*)    All other components within normal limits  COMPREHENSIVE METABOLIC PANEL - Abnormal; Notable for the following:    Sodium 131 (*)    Chloride 94 (*)    Glucose, Bld 229 (*)    BUN 26 (*)    Total Bilirubin 0.2 (*)    GFR calc non Af Amer 83 (*)    All other components within normal limits  URINALYSIS, ROUTINE W REFLEX MICROSCOPIC - Abnormal; Notable for the following:    Glucose, UA >1000 (*)    All other components within  normal limits  TYPE AND SCREEN  PROTIME-INR  ABO/RH  URINE MICROSCOPIC-ADD ON  URINE CULTURE  PHENYTOIN LEVEL, TOTAL  PHENYTOIN LEVEL, TOTAL  CBC  CREATININE, SERUM  BASIC METABOLIC PANEL  CBC  PHENYTOIN LEVEL, TOTAL  HEMOGLOBIN A1C   5:55 PM Reevaluation with update and discussion. After initial assessment and treatment, an updated evaluation reveals patient has persistent pain after the first dose of fentanyl. A repeat dose was ordered. I discussed the case with the on-callorthopedist Dr.Dean ; we'll request a medical admission and he will consult Elwyn Lowden L     1. Fall   2. Fracture of hip, right, closed       MDM  Fall with hip fracture, cause not clear. Patient is hyperglycemic and possibly dehydrated. No apparent urinary tract infection or severe metabolic abnormality. Chest x-ray is without pneumonia.        Flint Melter, MD 02/23/11 [phone]

## 2011-02-23 NOTE — Progress Notes (Signed)
ANTICOAGULATION CONSULT NOTE - Initial Consult  Pharmacy Consult for Warfarin Indication: VTE prophylaxis  Allergies  Allergen Reactions  . Aspirin Other (See Comments)    dirrahea  . Sulfa Antibiotics Nausea And Vomiting    Patient Measurements: Height: 5\' 6"  (167.6 cm) Weight: 110 lb (49.896 kg) IBW/kg (Calculated) : 59.3    Vital Signs: Temp: 97.9 F (36.6 C) (02/07 2141) Temp src: Oral (02/07 1845) BP: 156/74 mmHg (02/07 2141) Pulse Rate: 86  (02/07 2141)  Labs:  Basename 02/23/11 1130  HGB 11.0*  HCT 32.8*  PLT 152  APTT --  LABPROT 15.2  INR 1.18  HEPARINUNFRC --  CREATININE 0.62  CKTOTAL --  CKMB --  TROPONINI --   Estimated Creatinine Clearance: 44.2 ml/min (by C-G formula based on Cr of 0.62).  Medical History: Past Medical History  Diagnosis Date  . Hypertension   . Diabetes mellitus   . Hypothyroid   . Stroke   . Seizures   . Arthritis   . Coronary artery disease   . Depression     Medications:  Prescriptions prior to admission  Medication Sig Dispense Refill  . acetaminophen (TYLENOL) 500 MG tablet Take 1,000 mg by mouth every 6 (six) hours as needed. For pain      . amLODipine (NORVASC) 2.5 MG tablet Take 2.5 mg by mouth daily.      . brimonidine (ALPHAGAN P) 0.1 % SOLN Place 1 drop into both eyes 2 (two) times daily.      . carvedilol (COREG) 12.5 MG tablet Take 12.5 mg by mouth 2 (two) times daily with a meal.      . citalopram (CELEXA) 20 MG tablet Take 20 mg by mouth daily.      Marland Kitchen esomeprazole (NEXIUM) 40 MG capsule Take 40 mg by mouth daily before breakfast.      . insulin glargine (LANTUS) 100 UNIT/ML injection Inject 7-30 Units into the skin at bedtime.      . isosorbide dinitrate (ISORDIL) 30 MG tablet Take 30 mg by mouth 4 (four) times daily.      Marland Kitchen levothyroxine (SYNTHROID, LEVOTHROID) 50 MCG tablet Take 50 mcg by mouth daily.      . metFORMIN (GLUCOPHAGE) 500 MG tablet Take 1,000 mg by mouth 2 (two) times daily with a meal.       . nitrofurantoin, macrocrystal-monohydrate, (MACROBID) 100 MG capsule Take 100 mg by mouth 2 (two) times daily.      . phenytoin (DILANTIN) 100 MG ER capsule Take 100 mg by mouth 3 (three) times daily.      . potassium chloride (K-DUR,KLOR-CON) 10 MEQ tablet Take 10 mEq by mouth daily.      Marland Kitchen torsemide (DEMADEX) 10 MG tablet Take 10 mg by mouth daily.       Scheduled:    . sodium chloride   Intravenous STAT  . amLODipine  2.5 mg Oral Daily  . brimonidine  1 drop Both Eyes BID  . carvedilol  12.5 mg Oral BID WC  .  ceFAZolin (ANCEF) IV  1 g Intravenous Q8H  . citalopram  20 mg Oral Daily  . fentaNYL      . fentaNYL  50 mcg Intravenous Once  . fentaNYL  50 mcg Intravenous Once  . heparin  5,000 Units Subcutaneous Q8H  . insulin aspart  0-15 Units Subcutaneous TID WC  . insulin aspart  0-5 Units Subcutaneous QHS  . insulin glargine  20 Units Subcutaneous QHS  . isosorbide dinitrate  30  mg Oral QID  . levothyroxine  50 mcg Oral QAC breakfast  . ondansetron      . ondansetron (ZOFRAN) IV  4 mg Intravenous Once  . pantoprazole  80 mg Oral Q1200  . phenytoin  100 mg Oral TID  . DISCONTD: brimonidine  1 drop Both Eyes BID   Infusions:    . sodium chloride 999 mL (02/23/11 1658)  . lactated ringers    . DISCONTD: sodium chloride    . DISCONTD: sodium chloride     PRN: acetaminophen, acetaminophen, fentaNYL, HYDROcodone-acetaminophen, HYDROmorphone, HYDROmorphone, menthol-cetylpyridinium, meperidine, methocarbamol(ROBAXIN) IV, methocarbamol, metoCLOPramide (REGLAN) injection, metoCLOPramide, ondansetron (ZOFRAN) IV, ondansetron, phenol, promethazine, senna-docusate, DISCONTD: 0.9 % irrigation (POUR BTL), DISCONTD: ondansetron (ZOFRAN) IV, DISCONTD: ondansetron (ZOFRAN) IV, DISCONTD: ondansetron  Assessment: 78 yo F s/p nailing of femoral fx. Pharmacy to dose warfarin post op for DVT ppx.  Goal of Therapy:  INR 2-3   Plan:  Couamdin 4mg  tonight. Daily INR. Teaching prior to  d/c.  Gwen Her 02/23/2011,10:15 PM

## 2011-02-23 NOTE — Transfer of Care (Signed)
Immediate Anesthesia Transfer of Care Note  Patient: Courtney Pitts  Procedure(s) Performed:  INTRAMEDULLARY (IM) NAIL FEMORAL  Patient Location: PACU  Anesthesia Type: General  Level of Consciousness: awake and alert   Airway & Oxygen Therapy: Patient Spontanous Breathing and Patient connected to face mask oxygen  Post-op Assessment: Report given to PACU RN and Post -op Vital signs reviewed and stable  Post vital signs: Reviewed and stable  Complications: No apparent anesthesia complications

## 2011-02-23 NOTE — ED Notes (Signed)
Per pt's husband: pt was in the her kitchen taking her medication and slip on the fell in the kitchen. Pt had an un witness fall. Pt denies any loc. Pt c/o pain on the right hip. Pt is unable to extent right leg fully

## 2011-02-23 NOTE — H&P (Signed)
PCP:  No primary provider on file.   DOA:  02/23/2011 10:32 AM  Chief Complaint:  Fall/right hip pain  HPI: 78 years old woman with multiple comorbidity, was brought into the ER after she slipped and fell on her kitchen  floor this morning, patient stated that she was wearing socks , she denies any preceding dizziness, lightheadedness, chest pain or shortness of breath. She was found sitting on the floor by husband and started to complain of right hip pain. In the ED x-ray to showed right hip fracture, orthopedic service was consulted and they requested hospitalist admit and they will consult. Allergies: Allergies  Allergen Reactions  . Aspirin Other (See Comments)    dirrahea  . Sulfa Antibiotics Nausea And Vomiting    Prior to Admission medications   Medication Sig Start Date End Date Taking? Authorizing Provider  acetaminophen (TYLENOL) 500 MG tablet Take 1,000 mg by mouth every 6 (six) hours as needed. For pain   Yes Historical Provider, MD  amLODipine (NORVASC) 2.5 MG tablet Take 2.5 mg by mouth daily.   Yes Historical Provider, MD  brimonidine (ALPHAGAN P) 0.1 % SOLN Place 1 drop into both eyes 2 (two) times daily.   Yes Historical Provider, MD  carvedilol (COREG) 12.5 MG tablet Take 12.5 mg by mouth 2 (two) times daily with a meal.   Yes Historical Provider, MD  citalopram (CELEXA) 20 MG tablet Take 20 mg by mouth daily.   Yes Historical Provider, MD  esomeprazole (NEXIUM) 40 MG capsule Take 40 mg by mouth daily before breakfast.   Yes Historical Provider, MD  insulin glargine (LANTUS) 100 UNIT/ML injection Inject 7-30 Units into the skin at bedtime.   Yes Historical Provider, MD  isosorbide dinitrate (ISORDIL) 30 MG tablet Take 30 mg by mouth 4 (four) times daily.   Yes Historical Provider, MD  levothyroxine (SYNTHROID, LEVOTHROID) 50 MCG tablet Take 50 mcg by mouth daily.   Yes Historical Provider, MD  metFORMIN (GLUCOPHAGE) 500 MG tablet Take 1,000 mg by mouth 2 (two) times daily  with a meal.   Yes Historical Provider, MD  nitrofurantoin, macrocrystal-monohydrate, (MACROBID) 100 MG capsule Take 100 mg by mouth 2 (two) times daily.   Yes Historical Provider, MD  phenytoin (DILANTIN) 100 MG ER capsule Take 100 mg by mouth 3 (three) times daily.   Yes Historical Provider, MD  potassium chloride (K-DUR,KLOR-CON) 10 MEQ tablet Take 10 mEq by mouth daily.   Yes Historical Provider, MD  torsemide (DEMADEX) 10 MG tablet Take 10 mg by mouth daily.   Yes Historical Provider, MD    Past Medical History  Diagnosis Date  . Hypertension   . Diabetes mellitus   . Hypothyroid   .  TIA  Coronary artery disease status post CABG in 2006  Orthostatic hypotension  History of humerus fracture  History of recurrent UTI  History of recurrent falls  History of seizure disorder      Past Surgical History  Procedure Date  . Abdominal hysterectomy   . Appendectomy     Social History: Lives with husband, never smoker. No EtOH or illicit drug use.   Review of Systems:  Constitutional: Denies fever, chills, diaphoresis, appetite change and fatigue.  Respiratory: Denies SOB, DOE, cough, chest tightness,  and wheezing.   Cardiovascular: Denies chest pain, palpitations and leg swelling.  Gastrointestinal: Denies nausea, vomiting, abdominal pain, diarrhea, constipation, blood in stool and abdominal distention.  Genitourinary: Denies dysuria, urgency, frequency, hematuria, flank pain and difficulty urinating.  Musculoskeletal: complaining of right hip pain. Denies myalgias, back pain, joint swelling, arthralgias and gait problem.  Neurological: Denies dizziness, seizures, syncope, weakness, light-headedness, numbness and headaches.     Physical Exam:  Filed Vitals:   02/23/11 1036 02/23/11 1209  BP: 184/99 166/44  Pulse: 67 74  Temp: 97.4 F (36.3 C)   TempSrc: Oral   Resp: 22 16  Height: 5\' 6"  (1.676 m)   Weight: 49.896 kg (110 lb)   SpO2: 99% 95%    Constitutional:  Vital signs reviewed.  Patient is a  in mild  acute distress and cooperative with exam. Alert and oriented x2.  Head: Normocephalic and atraumatic Eyes: PERRL, EOMI, conjunctivae normal, No scleral icterus.  Neck: Supple, normal ROM, No JVD Cardiovascular: RRR, S1 normal, S2 normal, no MRG, pulses symmetric and intact bilaterally Pulmonary/Chest: CTAB, no wheezes, rales, or rhonchi Abdominal: Soft. Non-tender, non-distended, bowel sounds are normal, no masses, organomegaly, or guarding present.  Ext: no edema and no cyanosis, pulses palpable bilaterally  Right hip externally rotated with limited range of motion and tenderness.  Neurological: A&O x2, no focal neurological deficit appreciated. Labs on Admission:  Results for orders placed during the hospital encounter of 02/23/11 (from the past 48 hour(s))  CBC     Status: Abnormal   Collection Time   02/23/11 11:30 AM      Component Value Range Comment   WBC 15.0 (*) 4.0 - 10.5 (K/uL)    RBC 3.97  3.87 - 5.11 (MIL/uL)    Hemoglobin 11.0 (*) 12.0 - 15.0 (g/dL)    HCT 16.1 (*) 09.6 - 46.0 (%)    MCV 82.6  78.0 - 100.0 (fL)    MCH 27.7  26.0 - 34.0 (pg)    MCHC 33.5  30.0 - 36.0 (g/dL)    RDW 04.5 (*) 40.9 - 15.5 (%)    Platelets 152  150 - 400 (K/uL)   DIFFERENTIAL     Status: Abnormal   Collection Time   02/23/11 11:30 AM      Component Value Range Comment   Neutrophils Relative 88 (*) 43 - 77 (%)    Neutro Abs 13.2 (*) 1.7 - 7.7 (K/uL)    Lymphocytes Relative 6 (*) 12 - 46 (%)    Lymphs Abs 0.8  0.7 - 4.0 (K/uL)    Monocytes Relative 6  3 - 12 (%)    Monocytes Absolute 0.8  0.1 - 1.0 (K/uL)    Eosinophils Relative 1  0 - 5 (%)    Eosinophils Absolute 0.1  0.0 - 0.7 (K/uL)    Basophils Relative 0  0 - 1 (%)    Basophils Absolute 0.0  0.0 - 0.1 (K/uL)   COMPREHENSIVE METABOLIC PANEL     Status: Abnormal   Collection Time   02/23/11 11:30 AM      Component Value Range Comment   Sodium 131 (*) 135 - 145 (mEq/L)    Potassium 4.7  3.5  - 5.1 (mEq/L)    Chloride 94 (*) 96 - 112 (mEq/L)    CO2 25  19 - 32 (mEq/L)    Glucose, Bld 229 (*) 70 - 99 (mg/dL)    BUN 26 (*) 6 - 23 (mg/dL)    Creatinine, Ser 8.11  0.50 - 1.10 (mg/dL)    Calcium 9.3  8.4 - 10.5 (mg/dL)    Total Protein 6.6  6.0 - 8.3 (g/dL)    Albumin 3.5  3.5 - 5.2 (g/dL)    AST  18  0 - 37 (U/L)    ALT 20  0 - 35 (U/L)    Alkaline Phosphatase 109  39 - 117 (U/L)    Total Bilirubin 0.2 (*) 0.3 - 1.2 (mg/dL)    GFR calc non Af Amer 83 (*) >90 (mL/min)    GFR calc Af Amer >90  >90 (mL/min)   TYPE AND SCREEN     Status: Normal   Collection Time   02/23/11 11:30 AM      Component Value Range Comment   ABO/RH(D) A POS      Antibody Screen NEG      Sample Expiration 02/26/2011     PROTIME-INR     Status: Normal   Collection Time   02/23/11 11:30 AM      Component Value Range Comment   Prothrombin Time 15.2  11.6 - 15.2 (seconds)    INR 1.18  0.00 - 1.49    ABO/RH     Status: Normal   Collection Time   02/23/11 11:30 AM      Component Value Range Comment   ABO/RH(D) A POS     URINALYSIS, ROUTINE W REFLEX MICROSCOPIC     Status: Abnormal   Collection Time   02/23/11 12:18 PM      Component Value Range Comment   Color, Urine YELLOW  YELLOW     APPearance CLEAR  CLEAR     Specific Gravity, Urine 1.017  1.005 - 1.030     pH 6.5  5.0 - 8.0     Glucose, UA >1000 (*) NEGATIVE (mg/dL)    Hgb urine dipstick NEGATIVE  NEGATIVE     Bilirubin Urine NEGATIVE  NEGATIVE     Ketones, ur NEGATIVE  NEGATIVE (mg/dL)    Protein, ur NEGATIVE  NEGATIVE (mg/dL)    Urobilinogen, UA 0.2  0.0 - 1.0 (mg/dL)    Nitrite NEGATIVE  NEGATIVE     Leukocytes, UA NEGATIVE  NEGATIVE    URINE MICROSCOPIC-ADD ON     Status: Normal   Collection Time   02/23/11 12:18 PM      Component Value Range Comment   Urine-Other        Value: NO FORMED ELEMENTS SEEN ON URINE MICROSCOPIC EXAMINATION    Radiological Exams on Admission: Dg Chest 1 View  02/23/2011  *RADIOLOGY REPORT*  Clinical Data: Hip  fracture, fall, weakness  CHEST - 1 VIEW  Comparison: None  Findings: Borderline cardiomegaly noted.  Status post median sternotomy. No pulmonary edema.  No diagnostic pneumothorax.  Right midlung linear atelectasis or infiltrate.  IMPRESSION: No pulmonary edema.  No diagnostic pneumothorax.  Cardiomegaly. Right midlung linear atelectasis or infiltrate.  Original Report Authenticated By: Natasha Mead, M.D.   Dg Hip Complete Right  02/23/2011  *RADIOLOGY REPORT*  Clinical Data: Severe right hip pain status post fall today.  RIGHT HIP - COMPLETE 2+ VIEW  Comparison: None.  Findings: There is an acute intertrochanteric right femur fracture with resulting moderate varus angulation.  No dislocation or acute pelvic fracture is seen.  The patient is status post pinning of a subcapital fracture of the left femoral neck.  There is generalized osteopenia.  Scattered vascular calcifications are noted.  IMPRESSION: Angulated and mildly comminuted intertrochanteric right femur fracture.  Per CMS PQRS reporting requirements (PQRS Measure 24): Given the patient's age of greater than 50 and the fracture site (hip, distal radius, or spine), the patient should be tested for osteoporosis using DXA, and the appropriate treatment considered based  on the DXA results.  Original Report Authenticated By: Gerrianne Scale, M.D.   EKG: SR ,first degree HB ,nonspecific st-t wave changes ,no old EKG available for comparison.  Assessment/Plan Principal Problem:  *Fracture of right femur Active Problems:  Fall,mechanical   Dehydration  DM (diabetes mellitus), type 2, uncontrolled  HTN (hypertension)  CAD (coronary atherosclerotic disease) S/P CABG IN 06  Leukocytosis W/O fever  History of transient ischemic attack (TIA) PLAN:  admit to MedSurg floor  Orthopedic service was consulted, awaiting recommendations.  hold diuretics and gently hydrate, watch volume status very closely. Hold metformin, continue  Lantus insulin and  adjust the dose to 20 units at bedtime, add moderate NovoLog scale. Check hemoglobin A1c. Check  vitals for orthostasis, continue current antihypertensive regimen.AS per family she have history of orthostatsis . Continue Dilantin for seizure disorder, check level. Leukocytosis is probably secondary to de margination, UA is negative for pyuria, chest x-ray showed suspected atelectasis versus infiltrate however patient has no symptoms or exam findings to suggest pneumonia. I will hold off antibiotics for now. DVT and GI prophylaxis Would keep n.p.o. for now pending orthopedic recommendations, if she's not going for surgery today will place  her on carbohydrate modified diet . Patient will be considered moderate risk for surgery given her age and multiple comorbidities, however benefits of surgery will  include alleviation of  pain and  Increasing  Mobility. Code status : discussed with patient and family ,limited code /DNI    Time Spent on Admission: About 50 minutes   Tarus Briski 02/23/2011, 3:35 PM

## 2011-02-23 NOTE — Anesthesia Preprocedure Evaluation (Signed)
Anesthesia Evaluation  Patient identified by MRN, date of birth, ID band Patient awake    Reviewed: Allergy & Precautions, H&P , NPO status , Patient's Chart, lab work & pertinent test results  Airway Mallampati: II TM Distance: >3 FB Neck ROM: Full    Dental No notable dental hx.    Pulmonary neg pulmonary ROS,  clear to auscultation  Pulmonary exam normal       Cardiovascular hypertension, + CAD (s/p cabg) and neg cardio ROS Regular Normal    Neuro/Psych Seizures -,  PSYCHIATRIC DISORDERS Depression CVA, No Residual Symptoms Negative Neurological ROS  Negative Psych ROS   GI/Hepatic negative GI ROS, Neg liver ROS,   Endo/Other  Negative Endocrine ROSDiabetes mellitus-Hypothyroidism   Renal/GU negative Renal ROS  Genitourinary negative   Musculoskeletal negative musculoskeletal ROS (+)   Abdominal   Peds negative pediatric ROS (+)  Hematology negative hematology ROS (+)   Anesthesia Other Findings   Reproductive/Obstetrics negative OB ROS                           Anesthesia Physical Anesthesia Plan  ASA: III  Anesthesia Plan: General   Post-op Pain Management:    Induction: Intravenous  Airway Management Planned: Oral ETT  Additional Equipment:   Intra-op Plan:   Post-operative Plan: Extubation in OR  Informed Consent: I have reviewed the patients History and Physical, chart, labs and discussed the procedure including the risks, benefits and alternatives for the proposed anesthesia with the patient or authorized representative who has indicated his/her understanding and acceptance.   Dental advisory given  Plan Discussed with: CRNA  Anesthesia Plan Comments:         Anesthesia Quick Evaluation

## 2011-02-24 LAB — URINE CULTURE
Colony Count: NO GROWTH
Culture  Setup Time: 201302071420
Culture: NO GROWTH

## 2011-02-24 LAB — BASIC METABOLIC PANEL
BUN: 21 mg/dL (ref 6–23)
Calcium: 7.8 mg/dL — ABNORMAL LOW (ref 8.4–10.5)
Chloride: 96 mEq/L (ref 96–112)
Creatinine, Ser: 0.56 mg/dL (ref 0.50–1.10)
GFR calc Af Amer: >90 mL/min (ref >90)

## 2011-02-24 LAB — HEMOGLOBIN A1C: Hgb A1c MFr Bld: 8.1 % — ABNORMAL HIGH (ref <5.7)

## 2011-02-24 LAB — CBC
HCT: 20.7 % — ABNORMAL LOW (ref 36.0–46.0)
MCHC: 33.8 g/dL (ref 30.0–36.0)
MCV: 83.1 fL (ref 78.0–100.0)
Platelets: 116 10*3/uL — ABNORMAL LOW (ref 150–400)
RDW: 16.5 % — ABNORMAL HIGH (ref 11.5–15.5)
WBC: 8.3 10*3/uL (ref 4.0–10.5)

## 2011-02-24 LAB — GLUCOSE, CAPILLARY
Glucose-Capillary: 205 mg/dL — ABNORMAL HIGH (ref 70–99)
Glucose-Capillary: 164 mg/dL — ABNORMAL HIGH (ref 70–99)
Glucose-Capillary: 361 mg/dL — ABNORMAL HIGH (ref 70–99)

## 2011-02-24 LAB — PREPARE RBC (CROSSMATCH)

## 2011-02-24 LAB — HEMOGLOBIN AND HEMATOCRIT, BLOOD: Hemoglobin: 7.8 g/dL — ABNORMAL LOW (ref 12.0–15.0)

## 2011-02-24 MED ORDER — WARFARIN SODIUM 2.5 MG PO TABS
2.5000 mg | ORAL_TABLET | Freq: Once | ORAL | Status: AC
  Administered 2011-02-24: 2.5 mg via ORAL
  Filled 2011-02-24: qty 1

## 2011-02-24 MED ORDER — TRAMADOL HCL 50 MG PO TABS
50.0000 mg | ORAL_TABLET | Freq: Four times a day (QID) | ORAL | Status: DC | PRN
  Administered 2011-02-25 – 2011-02-28 (×8): 50 mg via ORAL
  Filled 2011-02-24 (×9): qty 1

## 2011-02-24 NOTE — Progress Notes (Signed)
PT Cancellation Note  Treatment cancelled today due to decreased Hgb, pt lethargy and ongoing blood transfusion.  Will follow in am.Courtney Pitts 02/24/2011, 1:01 PM

## 2011-02-24 NOTE — Clinical Documentation Improvement (Signed)
MALNUTRITION DOCUMENTATION CLARIFICATION  THIS DOCUMENT IS NOT A PERMANENT PART OF THE MEDICAL RECORD  TO RESPOND TO THE THIS QUERY, FOLLOW THE INSTRUCTIONS BELOW:  1. If needed, update documentation for the patient's encounter via the notes activity.  2. Access this query again and click edit on the In [employer].  3. After updating, or not, click F2 to complete all highlighted (required) fields concerning your review. Select "additional documentation in the medical record" OR "no additional documentation provided".  4. Click Sign note button.  5. The deficiency will fall out of your In Basket *Please let us know if you are not able to complete this workflow by phone or e-mail (listed below).  Please update your documentation within the medical record to reflect your response to this query.                                                                                        02/24/11   Dear Dr. Blake [facility],  In a better effort to capture your patient's severity of illness, reflect appropriate length of stay and utilization of resources, a review of the patient medical record has revealed the following indicators.    Based on your clinical judgment, please clarify and document in a progress note and/or discharge summary the clinical condition associated with the following supporting information:  In responding to this query please exercise your independent judgment.  The fact that a query is asked, does not imply that any particular answer is desired or expected.   Pt's BMI= 17.8. Please clarify whether or not BMI can be linked to one of he diagnoses listed below and document in pn  and d/c. Thank You!  [facility]: When linking BMI to a diagnosis please document both BMI and diagnosis together in pn for accuracy of SOI and ROM.   Possible Clinical Conditions?   _______Severe Malnutrition   _______Protein Calorie Malnutrition _______Severe Protein Calorie  Malnutrition _______Emaciation  _______Cachexia   _______Other Condition________________ _______Cannot clinically determine     Supporting Information: Risk Factors: Fx of R femur  Signs & Symptoms:  BMI-17.8 5'6"/110lbs   Treatment  monitioring   You may use possible, probable, or suspect with inpatient documentation. possible, probable, suspected diagnoses MUST be documented at the time of discharge  Reviewed:  no additional documentation provided 2/12/2013ljh  Thank You   Enis Slipper RN, BSN, CCDS Clinical Documentation Specialist [facility] Dept Pager: [phone] / E-mail: [email or web address] [employer] Information Management [place]

## 2011-02-24 NOTE — Progress Notes (Signed)
Subjective: PAIN BETTER CONTROLLED.  Objective: Weight change:   Intake/Output Summary (Last 24 hours) at 02/24/11 1815 Last data filed at 02/24/11 1714  Gross per 24 hour  Intake 4199.16 ml  Output    965 ml  Net 3234.16 ml    Filed Vitals:   02/24/11 1714  BP: 177/71  Pulse: 87  Temp:   Resp:     Alert and oriented x2.  Head: Normocephalic and atraumatic   Cardiovascular: RRR, S1 normal, S2 normal, no MRG, pulses symmetric and intact bilaterally  Pulmonary/Chest: CTAB, no wheezes, rales, or rhonchi  Abdominal: Soft. Non-tender, non-distended, bowel sounds are normal, no masses, organomegaly, or guarding present.  Ext: no edema and no cyanosis, pulses palpable bilaterally  Right hip externally rotated with limited range of motion and tenderness.  Neurological: A&O x2, no focal neurological deficit appreciated.   Lab Results: Results for orders placed during the hospital encounter of 02/23/11 (from the past 24 hour(s))  GLUCOSE, CAPILLARY     Status: Abnormal   Collection Time   02/23/11 10:49 PM      Component Value Range   Glucose-Capillary 195 (*) 70 - 99 (mg/dL)  BASIC METABOLIC PANEL     Status: Abnormal   Collection Time   02/24/11  4:05 AM      Component Value Range   Sodium 129 (*) 135 - 145 (mEq/L)   Potassium 4.0  3.5 - 5.1 (mEq/L)   Chloride 96  96 - 112 (mEq/L)   CO2 24  19 - 32 (mEq/L)   Glucose, Bld 202 (*) 70 - 99 (mg/dL)   BUN 21  6 - 23 (mg/dL)   Creatinine, Ser 2.13  0.50 - 1.10 (mg/dL)   Calcium 7.8 (*) 8.4 - 10.5 (mg/dL)   GFR calc non Af Amer 86 (*) >90 (mL/min)   GFR calc Af Amer >90  >90 (mL/min)  CBC     Status: Abnormal   Collection Time   02/24/11  4:05 AM      Component Value Range   WBC 8.3  4.0 - 10.5 (K/uL)   RBC 2.49 (*) 3.87 - 5.11 (MIL/uL)   Hemoglobin 7.0 (*) 12.0 - 15.0 (g/dL)   HCT 08.6 (*) 57.8 - 46.0 (%)   MCV 83.1  78.0 - 100.0 (fL)   MCH 28.1  26.0 - 34.0 (pg)   MCHC 33.8  30.0 - 36.0 (g/dL)   RDW 46.9 (*) 62.9 - 15.5  (%)   Platelets 116 (*) 150 - 400 (K/uL)  PHENYTOIN LEVEL, TOTAL     Status: Normal   Collection Time   02/24/11  4:05 AM      Component Value Range   Phenytoin Lvl 11.1  10.0 - 20.0 (ug/mL)  HEMOGLOBIN A1C     Status: Abnormal   Collection Time   02/24/11  4:05 AM      Component Value Range   Hemoglobin A1C 8.1 (*) <5.7 (%)   Mean Plasma Glucose 186 (*) <117 (mg/dL)  PROTIME-INR     Status: Abnormal   Collection Time   02/24/11  4:05 AM      Component Value Range   Prothrombin Time 15.5 (*) 11.6 - 15.2 (seconds)   INR 1.20  0.00 - 1.49   GLUCOSE, CAPILLARY     Status: Abnormal   Collection Time   02/24/11  7:44 AM      Component Value Range   Glucose-Capillary 205 (*) 70 - 99 (mg/dL)   Comment 1 Notify RN  Comment 2 Documented in Chart    PREPARE RBC (CROSSMATCH)     Status: Normal   Collection Time   02/24/11  9:00 AM      Component Value Range   Order Confirmation ORDER PROCESSED BY BLOOD BANK    GLUCOSE, CAPILLARY     Status: Abnormal   Collection Time   02/24/11 11:35 AM      Component Value Range   Glucose-Capillary 164 (*) 70 - 99 (mg/dL)  GLUCOSE, CAPILLARY     Status: Abnormal   Collection Time   02/24/11  4:16 PM      Component Value Range   Glucose-Capillary 361 (*) 70 - 99 (mg/dL)    Micro Results: Recent Results (from the past 240 hour(s))  URINE CULTURE     Status: Normal   Collection Time   02/23/11 12:18 PM      Component Value Range Status Comment   Specimen Description URINE, CATHETERIZED   Final    Special Requests NONE   Final    Culture  Setup Time [id number]   Final    Colony Count NO GROWTH   Final    Culture NO GROWTH   Final    Report Status 02/24/2011 FINAL   Final     Studies/Results: Dg Chest 1 View  02/23/2011  *RADIOLOGY REPORT*  Clinical Data: Hip fracture, fall, weakness  CHEST - 1 VIEW  Comparison: None  Findings: Borderline cardiomegaly noted.  Status post median sternotomy. No pulmonary edema.  No diagnostic pneumothorax.  Right  midlung linear atelectasis or infiltrate.  IMPRESSION: No pulmonary edema.  No diagnostic pneumothorax.  Cardiomegaly. Right midlung linear atelectasis or infiltrate.  Original Report Authenticated By: Natasha Mead, M.D.   Dg Hip Complete Right  02/23/2011  *RADIOLOGY REPORT*  Clinical Data: Severe right hip pain status post fall today.  RIGHT HIP - COMPLETE 2+ VIEW  Comparison: None.  Findings: There is an acute intertrochanteric right femur fracture with resulting moderate varus angulation.  No dislocation or acute pelvic fracture is seen.  The patient is status post pinning of a subcapital fracture of the left femoral neck.  There is generalized osteopenia.  Scattered vascular calcifications are noted.  IMPRESSION: Angulated and mildly comminuted intertrochanteric right femur fracture.  Per CMS PQRS reporting requirements (PQRS Measure 24): Given the patient's age of greater than 50 and the fracture site (hip, distal radius, or spine), the patient should be tested for osteoporosis using DXA, and the appropriate treatment considered based on the DXA results.  Original Report Authenticated By: Gerrianne Scale, M.D.   Dg Femur Right  02/23/2011  *RADIOLOGY REPORT*  Clinical Data: 78 year old female undergoing right hip fixation.  RIGHT FEMUR - 2 VIEW  Comparison: Preoperative study from 1238 hours same day.  Findings: Four intraoperative fluoroscopic views of the right hip. Intramedullary rod has been placed with proximal interlocking dynamic hip screw traversing the comminuted intertrochanteric fracture.  Hardware appears intact.  Significantly improved alignment, near anatomic.  IMPRESSION: ORIF right femur without adverse features.  Original Report Authenticated By: Harley Hallmark, M.D.   Dg Hip Portable 1 View Right  02/23/2011  *RADIOLOGY REPORT*  Clinical Data: 78 year old female postop right hip.  PORTABLE RIGHT HIP - 1 VIEW  Comparison: 2108 hours the same day and earlier.  Findings: Portable views of  the right femur.  Intramedullary rod with proximal interlocking dynamic hip screw.  Near anatomic alignment about the intertrochanteric fracture.  No adverse hardware features.  Calcified atherosclerosis.  Visible pelvis appears intact. Expected soft tissue postoperative changes.  IMPRESSION: Right femur ORIF with no adverse features.  Original Report Authenticated By: Harley Hallmark, M.D.   Dg C-arm 1-60 Min-no Report  02/23/2011  CLINICAL DATA: fractured right hip   C-ARM 1-60 MINUTES  Fluoroscopy was utilized by the requesting physician.  No radiographic  interpretation.     Medications: Scheduled Meds:   . sodium chloride   Intravenous STAT  . amLODipine  2.5 mg Oral Daily  . brimonidine  1 drop Both Eyes BID  . carvedilol  12.5 mg Oral BID WC  .  ceFAZolin (ANCEF) IV  1 g Intravenous Q8H  . citalopram  20 mg Oral Daily  . coumadin book   Does not apply Once  . heparin  5,000 Units Subcutaneous Q8H  . insulin aspart  0-15 Units Subcutaneous TID WC  . insulin aspart  0-5 Units Subcutaneous QHS  . insulin glargine  20 Units Subcutaneous QHS  . isosorbide dinitrate  30 mg Oral QID  . levothyroxine  50 mcg Oral QAC breakfast  . pantoprazole  80 mg Oral Q1200  . phenytoin  100 mg Oral TID  . warfarin  2.5 mg Oral ONCE-1800  . warfarin  4 mg Oral NOW  . warfarin   Does not apply Once   Continuous Infusions:   . sodium chloride 999 mL (02/24/11 0800)  . lactated ringers     PRN Meds:.acetaminophen, acetaminophen, menthol-cetylpyridinium, meperidine, methocarbamol(ROBAXIN) IV, methocarbamol, metoCLOPramide (REGLAN) injection, metoCLOPramide, ondansetron (ZOFRAN) IV, ondansetron, phenol, promethazine, senna-docusate, traMADol, DISCONTD: 0.9 % irrigation (POUR BTL), DISCONTD: fentaNYL, DISCONTD: HYDROcodone-acetaminophen, DISCONTD: HYDROmorphone, DISCONTD: HYDROmorphone, DISCONTD: ondansetron (ZOFRAN) IV DISCONTD: ondansetron (ZOFRAN) IV, DISCONTD: ondansetron  Assessment/Plan: Patient  Active Hospital Problem List: 1. Fracture of right femur s/p surgery: - per Ortho 2. Anemia of blood loss: - s/p 1 U PRBC tx. - Monitor H&H 3. Pain control: 4. DVT PPX:      LOS: 1 day   Naylee Frankowski 02/24/2011, 6:15 PM

## 2011-02-24 NOTE — Progress Notes (Signed)
Subjective: Pt awake   Objective: Vital signs in last 24 hours: Temp:  [97.6 F (36.4 C)-100 F (37.8 C)] 98.7 F (37.1 C) (02/08 1436) Pulse Rate:  [70-92] 86  (02/08 1436) Resp:  [16-20] 16  (02/08 1436) BP: (118-191)/(43-81) 154/59 mmHg (02/08 1436) SpO2:  [92 %-100 %] 97 % (02/08 1030) Weight:  [49.896 kg (110 lb)] 49.896 kg (110 lb) (02/07 1715)  Intake/Output from previous day: 02/07 0701 - 02/08 0700 In: 2205.8 [P.O.:480; I.V.:1475.8; IV Piggyback:250] Out: 2265 [Urine:2065; Blood:200] Intake/Output this shift: Total I/O In: 405.8 [P.O.:60; Blood:345.8] Out: -   Exam:  Sensation intact distally Intact pulses distally Dorsiflexion/Plantar flexion intact  Labs:  Basename 02/24/11 0405 02/23/11 1130  HGB 7.0* 11.0*    Basename 02/24/11 0405 02/23/11 1130  WBC 8.3 15.0*  RBC 2.49* 3.97  HCT 20.7* 32.8*  PLT 116* 152    Basename 02/24/11 0405 02/23/11 1130  NA 129* 131*  K 4.0 4.7  CL 96 94*  CO2 24 25  BUN 21 26*  CREATININE 0.56 0.62  GLUCOSE 202* 229*  CALCIUM 7.8* 9.3    Basename 02/24/11 0405 02/23/11 1130  LABPT -- --  INR 1.20 1.18    Assessment/Plan: Agree with transfusion - place foley - start cpm - hold narc pain meds due to sedation   Takeya Marquis SCOTT 02/24/2011, 3:53 PM

## 2011-02-24 NOTE — Progress Notes (Signed)
Inpatient Diabetes Program Recommendations  AACE/ADA: New Consensus Statement on Inpatient Glycemic Control (2009)  Target Ranges:  Prepandial:   less than 140 mg/dL      Peak postprandial:   less than 180 mg/dL (1-2 hours)      Critically ill patients:  140 - 180 mg/dL   Reason for Visit: Hyperglycemia  Results for Courtney Pitts, Courtney Pitts (MRN [id number]) as of 02/24/2011 10:22  Ref. Range 02/23/2011 20:54 02/23/2011 22:49 02/24/2011 07:44  Glucose-Capillary Latest Range: 70-99 mg/dL 536 (H) 644 (H) 034 (H)    Inpatient Diabetes Program Recommendations Insulin - Basal: Increase Lantus to 25 units QHS Insulin - Meal Coverage: Add Novolog 3 units tidwc for meal coverage insulin

## 2011-02-24 NOTE — Clinical Documentation Improvement (Signed)
Anemia Blood Loss Clarification  THIS DOCUMENT IS NOT A PERMANENT PART OF THE MEDICAL RECORD  RESPOND TO THE THIS QUERY, FOLLOW THE INSTRUCTIONS BELOW:  1. If needed, update documentation for the patient's encounter via the notes activity.  2. Access this query again and click edit on the In [employer].  3. After updating, or not, click F2 to complete all highlighted (required) fields concerning your review. Select "additional documentation in the medical record" OR "no additional documentation provided".  4. Click Sign note button.  5. The deficiency will fall out of your In Basket *Please let us know if you are not able to complete this workflow by phone or e-mail (listed below).        02/24/11  Dear Dr. Blake Divine Marton Redwood  In an effort to better capture your patient's severity of illness, reflect appropriate length of stay and utilization of resources, a review of the patient medical record has revealed the following indicators.    Based on your clinical judgment, please clarify and document in a progress note and/or discharge summary the clinical condition associated with the following supporting information:  In responding to this query please exercise your independent judgment.  The fact that a query is asked, does not imply that any particular answer is desired or expected.  Possible Clinical Conditions?   " Expected Acute Blood Loss Anemia  " Acute Blood Loss Anemia  " Acute on chronic blood loss anemia   " Other Condition________________  " Cannot Clinically Determine  Risk Factors: (recent surgery, pre op anemia, EBL in OR)  Supporting Information: *Fracture of right femur  Dehydration, DM,   Signs and Symptoms  Abn H/H  Diagnostics: Pre Op H/H Component HGB HCT  Latest Ref Rng 12.0 - 15.0 g/dL 16.1 - 09.6 %  0/04/5407 11.0 (L) 32.8 (L)   Post Op H/H Component HGB HCT  Latest Ref Rng 12.0 - 15.0 g/dL 81.1 - 91.4 %  07/24/2954 7.0 (L) 20.7 (L)     Treatments: Transfusion: IV fluids  0.9 %  sodium chloride infusion  Serial H&H monitoring Transfuse RBC (Order [id number])  Nursing Transfusion  : [id number]  Date:  02/24/2011  Ordering/Authorizing:  Kathlen Mody, MD      Reviewed: additional documentation in the medical record 02/27/11 ljh  Thank You,  Enis Slipper  RN, BSN, CCDS Clinical Documentation Specialist [facility] Dept Pager: [phone] / E-mail: [email or web address]  Health Information Management [place]

## 2011-02-24 NOTE — Progress Notes (Signed)
ANTICOAGULATION CONSULT NOTE - Follow-Up Consult  Pharmacy Consult for Warfarin Indication: VTE prophylaxis  Allergies  Allergen Reactions  . Aspirin Other (See Comments)    dirrahea  . Sulfa Antibiotics Nausea And Vomiting    Patient Measurements: Height: 5\' 6"  (167.6 cm) Weight: 110 lb (49.896 kg) IBW/kg (Calculated) : 59.3    Vital Signs: Temp: 98.6 F (37 C) (02/08 0626) Temp src: Oral (02/08 0626) BP: 154/66 mmHg (02/08 0806) Pulse Rate: 85  (02/08 0806)  Labs:  Basename 02/24/11 0405 02/23/11 1130  HGB 7.0* 11.0*  HCT 20.7* 32.8*  PLT 116* 152  APTT -- --  LABPROT 15.5* 15.2  INR 1.20 1.18  HEPARINUNFRC -- --  CREATININE 0.56 0.62  CKTOTAL -- --  CKMB -- --  TROPONINI -- --   Estimated Creatinine Clearance: 44.2 ml/min (by C-G formula based on Cr of 0.56).  Medical History: Past Medical History  Diagnosis Date  . Hypertension   . Diabetes mellitus   . Hypothyroid   . Stroke   . Seizures   . Arthritis   . Coronary artery disease   . Depression     Medications:  Prescriptions prior to admission  Medication Sig Dispense Refill  . acetaminophen (TYLENOL) 500 MG tablet Take 1,000 mg by mouth every 6 (six) hours as needed. For pain      . amLODipine (NORVASC) 2.5 MG tablet Take 2.5 mg by mouth daily.      . brimonidine (ALPHAGAN P) 0.1 % SOLN Place 1 drop into both eyes 2 (two) times daily.      . carvedilol (COREG) 12.5 MG tablet Take 12.5 mg by mouth 2 (two) times daily with a meal.      . citalopram (CELEXA) 20 MG tablet Take 20 mg by mouth daily.      Marland Kitchen esomeprazole (NEXIUM) 40 MG capsule Take 40 mg by mouth daily before breakfast.      . insulin glargine (LANTUS) 100 UNIT/ML injection Inject 7-30 Units into the skin at bedtime.      . isosorbide dinitrate (ISORDIL) 30 MG tablet Take 30 mg by mouth 4 (four) times daily.      Marland Kitchen levothyroxine (SYNTHROID, LEVOTHROID) 50 MCG tablet Take 50 mcg by mouth daily.      . metFORMIN (GLUCOPHAGE) 500 MG  tablet Take 1,000 mg by mouth 2 (two) times daily with a meal.      . nitrofurantoin, macrocrystal-monohydrate, (MACROBID) 100 MG capsule Take 100 mg by mouth 2 (two) times daily.      . phenytoin (DILANTIN) 100 MG ER capsule Take 100 mg by mouth 3 (three) times daily.      . potassium chloride (K-DUR,KLOR-CON) 10 MEQ tablet Take 10 mEq by mouth daily.      Marland Kitchen torsemide (DEMADEX) 10 MG tablet Take 10 mg by mouth daily.       Scheduled:     . sodium chloride   Intravenous STAT  . amLODipine  2.5 mg Oral Daily  . brimonidine  1 drop Both Eyes BID  . carvedilol  12.5 mg Oral BID WC  .  ceFAZolin (ANCEF) IV  1 g Intravenous Q8H  . citalopram  20 mg Oral Daily  . coumadin book   Does not apply Once  . fentaNYL      . fentaNYL  50 mcg Intravenous Once  . fentaNYL  50 mcg Intravenous Once  . heparin  5,000 Units Subcutaneous Q8H  . insulin aspart  0-15 Units Subcutaneous TID WC  .  insulin aspart  0-5 Units Subcutaneous QHS  . insulin glargine  20 Units Subcutaneous QHS  . isosorbide dinitrate  30 mg Oral QID  . levothyroxine  50 mcg Oral QAC breakfast  . ondansetron      . ondansetron (ZOFRAN) IV  4 mg Intravenous Once  . pantoprazole  80 mg Oral Q1200  . phenytoin  100 mg Oral TID  . warfarin  4 mg Oral NOW  . warfarin   Does not apply Once  . DISCONTD: brimonidine  1 drop Both Eyes BID   Infusions:     . sodium chloride 999 mL (02/23/11 1658)  . lactated ringers    . DISCONTD: sodium chloride    . DISCONTD: sodium chloride     PRN: acetaminophen, acetaminophen, fentaNYL, HYDROcodone-acetaminophen, HYDROmorphone, HYDROmorphone, menthol-cetylpyridinium, meperidine, methocarbamol(ROBAXIN) IV, methocarbamol, metoCLOPramide (REGLAN) injection, metoCLOPramide, ondansetron (ZOFRAN) IV, ondansetron, phenol, promethazine, senna-docusate, DISCONTD: 0.9 % irrigation (POUR BTL), DISCONTD: ondansetron (ZOFRAN) IV, DISCONTD: ondansetron (ZOFRAN) IV, DISCONTD: ondansetron  Inpatient warfarin  doses: 4mg  on 2/7.   Assessment:  78 yo F s/p nailing of femoral fx.   Warfarin initiation in progress for VTE prophylaxis  Warfarin maintenance dose requirements could be small due to age, gender, small body habitus.  Concomitant phenytoin noted.  Also on prophylactic-dose SQ heparin  coumHgb 7.0 postop - awaiting review by ortho  Goal of Therapy:  INR 2-3   Plan:   Warfarin 2.5mg  PO today  Recheck INR tomorrow AM.  Elie Goody, PharmD, BCPS Pager: [phone] 02/24/2011  8:15 AM

## 2011-02-24 NOTE — Plan of Care (Signed)
Problem: Phase I Progression Outcomes Goal: Voiding-avoid urinary catheter unless indicated Outcome: Not Met (add Reason) Urinary catheter placed @ 1608, patient had not voided for >6 hours

## 2011-02-24 NOTE — Evaluation (Signed)
Physical Therapy Evaluation Patient Details Name: Courtney Pitts MRN: [id number] DOB: Oct 11, 1929 Today's Date: 02/24/2011  Problem List:  Patient Active Problem List  Diagnoses  . Fracture of right femur  . Fall  . Dehydration  . DM (diabetes mellitus), type 2, uncontrolled  . HTN (hypertension)  . CAD (coronary atherosclerotic disease)  . Leukocytosis  . History of transient ischemic attack (TIA)    Past Medical History:  Past Medical History  Diagnosis Date  . Hypertension   . Diabetes mellitus   . Hypothyroid   . Stroke   . Seizures   . Arthritis   . Coronary artery disease   . Depression    Past Surgical History:  Past Surgical History  Procedure Date  . Abdominal hysterectomy   . Appendectomy   . Coronary artery bypass graft 2006    Triple Bipass @ [facility]    PT Assessment/Plan/Recommendation PT Assessment Clinical Impression Statement: Pt with R femur fx s/p IM nailing presents with decreased R LE strength, significant limitations in functional mobility and questionable cognition.  Pt will benefit from skilled PT intervention to maximize IND for next venue of care. PT Recommendation/Assessment: Patient will need skilled PT in the acute care venue PT Problem List: Decreased strength;Decreased range of motion;Decreased activity tolerance;Decreased balance;Decreased mobility;Decreased cognition;Decreased knowledge of use of DME;Pain PT Therapy Diagnosis : Difficulty walking PT Plan PT Frequency: Min 3X/week PT Treatment/Interventions: DME instruction;Gait training;Functional mobility training;Therapeutic activities;Therapeutic exercise;Patient/family education PT Recommendation Recommendations for Other Services: OT consult Follow Up Recommendations: Skilled nursing facility Equipment Recommended: Defer to next venue PT Goals  Acute Rehab PT Goals PT Goal Formulation: With patient (pt expressed limited input/insight to goals) Time For Goal Achievement: 7  days Pt will go Supine/Side to Sit: with min assist PT Goal: Supine/Side to Sit - Progress: Goal set today Pt will go Sit to Supine/Side: with min assist PT Goal: Sit to Supine/Side - Progress: Goal set today Pt will go Sit to Stand: with min assist PT Goal: Sit to Stand - Progress: Goal set today Pt will go Stand to Sit: with min assist PT Goal: Stand to Sit - Progress: Goal set today Pt will Ambulate: 16 - 50 feet;with mod assist;with rolling walker PT Goal: Ambulate - Progress: Goal set today  PT Evaluation Precautions/Restrictions  Restrictions Weight Bearing Restrictions: Yes RLE Weight Bearing: Partial weight bearing RLE Partial Weight Bearing Percentage or Pounds: 25% Prior Functioning  Home Living Lives With: Spouse Prior Function Level of Independence:  (Pt with difficulty providing information) Cognition Cognition Arousal/Alertness: Awake/alert Overall Cognitive Status: Difficult to assess Difficult to assess due to: other (comment) (appropriateness of responses fluctuates as does follow throu) Orientation Level: Oriented to person;Oriented to place;Oriented to situation Cognition - Other Comments: Pt able to converse about distant memories such as previous employment but when questioned about recent abilities answers primarily with blank stare.  Pt agrees to tasks when requested but with limited follow through to commands. Sensation/Coordination Coordination Gross Motor Movements are Fluid and Coordinated: Not tested Extremity Assessment RUE Assessment RUE Assessment: Exceptions to [facility] (shoulder flex/abd to 50 degrees) LUE Assessment LUE Assessment: Exceptions to [facility] (shoulder flex/abd to 75 degreesAROM) RLE Assessment RLE Assessment: Within Functional Limits (limitations in following cues for accurate testing) LLE Assessment LLE Assessment: Exceptions to [facility] (limitations in ability to follow cues for accurate assessmen) Mobility (including Balance) Bed  Mobility Bed Mobility: Yes Supine to Sit: 1: +2 Total assist Supine to Sit Details (indicate cue type and reason):  pt agreeable to sitting at bedside but makes no attempt to assist Sit to Supine: 1: +2 Total assist Sit to Supine - Details (indicate cue type and reason): pt 0% Transfers Transfers: Yes Sit to Stand: 1: +2 Total assist Sit to Stand Details (indicate cue type and reason): pt agreeable to attempt standing but makes no attempts to assist    Exercise    End of Session PT - End of Session Equipment Utilized During Treatment: Gait belt Activity Tolerance: Other (comment);Patient limited by fatigue Patient left: in bed;with call bell in reach Nurse Communication: Mobility status for transfers;Mobility status for ambulation General Behavior During Session: Flat affect Cognition:  (difficult to assess )  Arieona Swaggerty 02/24/2011, 5:48 PM

## 2011-02-24 NOTE — Op Note (Signed)
NAMEBETHANEY, OSHANA NO.:  [id number]  MEDICAL RECORD NO.:  [id number]  LOCATION:  1612                         FACILITY:  [facility]  PHYSICIAN:  Burnard Bunting, M.D.    DATE OF BIRTH:  09-Sep-1929  DATE OF PROCEDURE:  02/23/2011 DATE OF DISCHARGE:                              OPERATIVE REPORT   PREOPERATIVE DIAGNOSIS:  Right hip intertrochanteric fracture.  POSTOPERATIVE DIAGNOSIS:  Right hip intertrochanteric fracture.  PROCEDURE:  Right hip intertrochanteric fracture, intramedullary hip screw placement.  SURGEON:  Burnard Bunting, M.D.  ASSISTANT:  None.  ANESTHESIA:  General endotracheal.  ESTIMATED BLOOD LOSS:  100 cc.  DRAINS:  None.  INDICATIONS:  Courtney Pitts is an ambulatory patient with right hip pain and fracture.  She presents now for operative management after explanation of risks and benefits.  PROCEDURE IN DETAIL:  The patient was brought to operating room where general endotracheal anesthesia was induced.  Preoperative antibiotics were administered.  The patient was placed on the fracture table in a lithotomy position with the left peroneal nerve well padded.  The patient was placed in traction and internal rotation.  Under fluoroscopic guidance, a near anatomic reduction of the fracture was achieved.  The area was then prescrubbed with alcohol and Betadine, which was allowed to air dry, prepped with DuraPrep solution, draped in a sterile manner.  Time-out was called.  Operative field was prepped with DuraPrep solution and draped in a sterile manner with wall drape. Incision was made 4 fingerbreadths proximal to the greater trochanter. Guide pin was placed and checked under fluoroscopy.  Proximal reaming was performed in accordance with preoperative templating a [employer] 10 x 36 intramedullary hip screw.  The nail was placed down the femoral shaft.  There was separate incision, a lag screw was placed into the just below the equator  of the head in the center position on the AP and lateral.  The traction was released and a second compression screw was placed into the lag screw in order to facilitate fracture reduction.  At this time, the jigs were removed. Both incisions were irrigated and closed using a 0-Vicryl suture, 2-0 Vicryl suture, and staples.  Mepilex dressing was applied.  The patient tolerated well without immediate complications.     Burnard Bunting, M.D.     GSD/MEDQ  D:  02/23/2011  T:  02/24/2011  Job:  [id number]

## 2011-02-25 ENCOUNTER — Other Ambulatory Visit (HOSPITAL_COMMUNITY): Payer: Medicare Other

## 2011-02-25 ENCOUNTER — Inpatient Hospital Stay (HOSPITAL_COMMUNITY): Payer: Medicare Other

## 2011-02-25 LAB — BASIC METABOLIC PANEL
Calcium: 8.1 mg/dL — ABNORMAL LOW (ref 8.4–10.5)
GFR calc Af Amer: >90 mL/min (ref >90)
GFR calc non Af Amer: 87 mL/min — ABNORMAL LOW (ref >90)
Potassium: 4 mEq/L (ref 3.5–5.1)
Sodium: 131 mEq/L — ABNORMAL LOW (ref 135–145)

## 2011-02-25 LAB — GLUCOSE, CAPILLARY

## 2011-02-25 LAB — CBC
MCH: 27.7 pg (ref 26.0–34.0)
MCHC: 33.5 g/dL (ref 30.0–36.0)
Platelets: 107 10*3/uL — ABNORMAL LOW (ref 150–400)

## 2011-02-25 LAB — PREPARE RBC (CROSSMATCH)

## 2011-02-25 LAB — PROTIME-INR
INR: 1.76 — ABNORMAL HIGH (ref 0.00–1.49)
Prothrombin Time: 20.8 seconds — ABNORMAL HIGH (ref 11.6–15.2)

## 2011-02-25 LAB — SODIUM, URINE, RANDOM: Sodium, Ur: 7 mEq/L

## 2011-02-25 MED ORDER — WARFARIN SODIUM 2 MG PO TABS
2.0000 mg | ORAL_TABLET | Freq: Once | ORAL | Status: AC
  Administered 2011-02-25: 2 mg via ORAL
  Filled 2011-02-25: qty 1

## 2011-02-25 MED ORDER — INSULIN GLARGINE 100 UNIT/ML ~~LOC~~ SOLN
28.0000 [IU] | Freq: Every day | SUBCUTANEOUS | Status: DC
  Administered 2011-02-25: 28 [IU] via SUBCUTANEOUS
  Filled 2011-02-25: qty 3

## 2011-02-25 MED ORDER — SODIUM CHLORIDE 0.9 % IV SOLN: 999.0000 mL | INTRAVENOUS | Status: DC

## 2011-02-25 MED ORDER — INSULIN ASPART 100 UNIT/ML ~~LOC~~ SOLN
3.0000 [IU] | Freq: Three times a day (TID) | SUBCUTANEOUS | Status: DC
  Administered 2011-02-25 – 2011-02-26 (×5): 3 [IU] via SUBCUTANEOUS

## 2011-02-25 MED ORDER — SODIUM CHLORIDE 0.9 % IV SOLN
INTRAVENOUS | Status: DC
  Administered 2011-02-25: 17:00:00 via INTRAVENOUS
  Administered 2011-02-26: 1000 mL via INTRAVENOUS

## 2011-02-25 MED ORDER — MOXIFLOXACIN HCL IN NACL 400 MG/250ML IV SOLN
400.0000 mg | Freq: Every day | INTRAVENOUS | Status: DC
  Administered 2011-02-25 – 2011-02-27 (×3): 400 mg via INTRAVENOUS
  Filled 2011-02-25 (×4): qty 250

## 2011-02-25 NOTE — Evaluation (Signed)
Occupational Therapy Evaluation Patient Details Name: Courtney Pitts MRN: [id number] DOB: [birth year] Today's Date: 02/25/2011  Problem List:  Patient Active Problem List  Diagnoses  . Fracture of right femur  . Fall  . Dehydration  . DM (diabetes mellitus), type 2, uncontrolled  . HTN (hypertension)  . CAD (coronary atherosclerotic disease)  . Leukocytosis  . History of transient ischemic attack (TIA)    Past Medical History:  Past Medical History  Diagnosis Date  . Hypertension   . Diabetes mellitus   . Hypothyroid   . Stroke   . Seizures   . Arthritis   . Coronary artery disease   . Depression    Past Surgical History:  Past Surgical History  Procedure Date  . Abdominal hysterectomy   . Appendectomy   . Coronary artery bypass graft 2006    Triple Bipass @ [facility]    OT Assessment/Plan/Recommendation OT Assessment Clinical Impression Statement: 78yr old female admitted after fall resulting in right femur fracture.  Pt currently needs total- total +2 assistance with all functional transfers and LB selfcare.  Feel she will benefit from acute OT services to address her current dependence with all ADLs.  Feel that pt will need follow-up SNF for further rehab.  OT Recommendation/Assessment: Patient will need skilled OT in the acute care venue OT Problem List: Decreased strength;Decreased activity tolerance;Impaired balance (sitting and/or standing);Decreased knowledge of precautions;Decreased knowledge of use of DME or AE;Decreased safety awareness;Decreased cognition;Pain Barriers to Discharge: Decreased caregiver support OT Therapy Diagnosis : Generalized weakness;Acute pain OT Plan OT Frequency: Min 1X/week OT Treatment/Interventions: Self-care/ADL training;Therapeutic activities;DME and/or AE instruction;Balance training;Patient/family education OT Recommendation Follow Up Recommendations: Skilled nursing facility Equipment Recommended: Defer to next  venue Individuals Consulted Consulted and Agree with Results and Recommendations: Patient OT Goals Acute Rehab OT Goals OT Goal Formulation: With patient Time For Goal Achievement: 2 weeks ADL Goals Pt Will Perform Lower Body Bathing: with max assist;Sit to stand from bed;with adaptive equipment ADL Goal: Lower Body Bathing - Progress: Goal set today Pt Will Perform Lower Body Dressing: with max assist;Sit to stand from chair;with adaptive equipment ADL Goal: Lower Body Dressing - Progress: Goal set today Pt Will Transfer to Toilet: Maintaining weight bearing status;with DME;3-in-1;with max assist ADL Goal: Toilet Transfer - Progress: Goal set today Pt Will Perform Toileting - Clothing Manipulation: with max assist;Sitting on 3-in-1 or toilet;Standing ADL Goal: Toileting - Clothing Manipulation - Progress: Goal set today Pt Will Perform Toileting - Hygiene: with max assist;Sit to stand from 3-in-1/toilet ADL Goal: Toileting - Hygiene - Progress: Goal set today Miscellaneous OT Goals Miscellaneous OT Goal #1: Pt will demeonstrate increased awareness by stating reason for hospitalization with no more than ine questioning cue. OT Goal: Miscellaneous Goal #1 - Progress: Goal set today  OT Evaluation Precautions/Restrictions  Precautions Precautions: Fall Required Braces or Orthoses: No Restrictions Weight Bearing Restrictions: Yes RLE Weight Bearing: Partial weight bearing RLE Partial Weight Bearing Percentage or Pounds: 25% Prior Functioning Home Living Lives With: Spouse Receives Help From: Personal care attendant Additional Comments: Pt planning D/C to SNF so did not go into detail about home environment. Prior Function Level of Independence: Needs assistance with ADLs Vocation: Retired ADL ADL Eating/Feeding: Simulated;Set up Where Assessed - Eating/Feeding: Chair Grooming: Performed;Wash/dry face;Set up Where Assessed - Grooming: Sitting, chair Upper Body Bathing:  Simulated;Set up Where Assessed - Upper Body Bathing: Sitting, chair Lower Body Bathing: Simulated;+2 Total assistance;Comment for patient % Lower Body Bathing Details (indicate cue type  and reason): Pt 20 % for sit to stand. Where Assessed - Lower Body Bathing: Sit to stand from chair Upper Body Dressing: Simulated;Set up Where Assessed - Upper Body Dressing: Sitting, chair Lower Body Dressing: +2 Total assistance;Simulated Lower Body Dressing Details (indicate cue type and reason): Pt 20 % for sit to stand. Where Assessed - Lower Body Dressing: Sit to stand from chair Toilet Transfer: Simulated;+2 Total assistance Toilet Transfer Details (indicate cue type and reason): pt 20% Toilet Transfer Method: Stand pivot Toilet Transfer Equipment: Other (comment) (Simulated to bedside chair.  Pt declined need to toilet.) Toileting - Clothing Manipulation: Simulated;+2 Total assistance Toileting - Clothing Manipulation Details (indicate cue type and reason): Pt 20% for sit to stand. Where Assessed - Toileting Clothing Manipulation: Other (comment) (Simulated to bedside chair.  Pt declined need to toilet.) Toileting - Hygiene: +2 Total assistance Toileting - Hygiene Details (indicate cue type and reason): Pt 20% for sit to stand. Where Assessed - Toileting Hygiene: Other (comment) (Simulated to bedside chair.  Pt declined need to toilet.) Tub/Shower Transfer: Not assessed Tub/Shower Transfer Method: Not assessed Ambulation Related to ADLs: Pt currently unable to ambulate. ADL Comments: Pt requires total assist +2 for slefcare and transfers.  Unable to maintain 25 % weightbearing ove the RLE.  Will also benefit from education on AE for LB selfcare.   Vision/Perception  Vision - History Baseline Vision: Wears glasses all the time Patient Visual Report: No change from baseline Vision - Assessment Vision Assessment: Vision not tested Perception Perception: Within Functional Limits Praxis Praxis:  Intact Cognition Cognition Arousal/Alertness: Awake/alert Overall Cognitive Status: Impaired Memory: Decreased recall of precautions Memory Deficits: Unable to recall 25 % weightbearing or reason for being in the hospital. Orientation Level: Oriented to person;Oriented to place;Disoriented to time;Disoriented to situation Sensation/Coordination Sensation Light Touch: Appears Intact Stereognosis: Not tested Hot/Cold: Not tested Proprioception: Not tested Coordination Gross Motor Movements are Fluid and Coordinated: Yes Fine Motor Movements are Fluid and Coordinated: Yes Extremity Assessment RUE Assessment RUE Assessment: Exceptions to [facility] RUE AROM (degrees) RUE Overall AROM Comments: Pt with history of right humerus fx.  Only able to perform about 50-60 degrees active shoulder flexion/scaption.  Limited internal and external rotation as well.  All othe joints WFLs for AROM.  Grip strength and elbow strength at 3+/5. LUE Assessment LUE Assessment: Within Functional Limits (Overall strength 4/5 throughout.) Mobility  Bed Mobility Bed Mobility: Yes Supine to Sit: 1: +1 Total assist Transfers Transfers: Yes Sit to Stand: 1: +2 Total assist;Other (comment) (Pt 20 %)  End of Session OT - End of Session Activity Tolerance: Patient limited by pain Patient left: in chair;with call bell in reach Nurse Communication: Mobility status for transfers General Behavior During Session: [facility] for tasks performed Cognition: Impaired   Chamberlain Steinborn OTR/L 02/25/2011, 12:48 PM  Pager number [phone]

## 2011-02-25 NOTE — Progress Notes (Signed)
Subjective: Feels better than yesterday Sitting in the chair.  Fever last night.   Objective: Weight change:   Intake/Output Summary (Last 24 hours) at 02/25/11 1156 Last data filed at 02/25/11 1610  Gross per 24 hour  Intake 2514.99 ml  Output    850 ml  Net 1664.99 ml    Filed Vitals:   02/25/11 0623  BP: 183/71  Pulse: 89  Temp: 98.2 F (36.8 C)  Resp: 22  Alert and oriented   Cardiovascular: RRR, S1 normal, S2 normal, no MRG, pulses symmetric and intact bilaterally  Pulmonary/Chest: CTAB, no wheezes, rales,  Scattered  rhonchi  Abdominal: Soft. Non-tender, non-distended, bowel sounds are normal, no masses, organomegaly, or guarding present.  Ext: no edema and no cyanosis, pulses palpable bilaterally  Right hip tenderness present.     Lab Results: Results for orders placed during the hospital encounter of 02/23/11 (from the past 24 hour(s))  GLUCOSE, CAPILLARY     Status: Abnormal   Collection Time   02/24/11  4:16 PM      Component Value Range   Glucose-Capillary 361 (*) 70 - 99 (mg/dL)  GLUCOSE, CAPILLARY     Status: Abnormal   Collection Time   02/24/11 10:25 PM      Component Value Range   Glucose-Capillary 383 (*) 70 - 99 (mg/dL)   Comment 1 Notify RN    HEMOGLOBIN AND HEMATOCRIT, BLOOD     Status: Abnormal   Collection Time   02/24/11 11:00 PM      Component Value Range   Hemoglobin 7.8 (*) 12.0 - 15.0 (g/dL)   HCT 96.0 (*) 45.4 - 46.0 (%)  CBC     Status: Abnormal   Collection Time   02/25/11  4:22 AM      Component Value Range   WBC 6.7  4.0 - 10.5 (K/uL)   RBC 2.56 (*) 3.87 - 5.11 (MIL/uL)   Hemoglobin 7.1 (*) 12.0 - 15.0 (g/dL)   HCT 09.8 (*) 11.9 - 46.0 (%)   MCV 82.8  78.0 - 100.0 (fL)   MCH 27.7  26.0 - 34.0 (pg)   MCHC 33.5  30.0 - 36.0 (g/dL)   RDW 14.7 (*) 82.9 - 15.5 (%)   Platelets 107 (*) 150 - 400 (K/uL)  BASIC METABOLIC PANEL     Status: Abnormal   Collection Time   02/25/11  4:22 AM      Component Value Range   Sodium 131 (*) 135 -  145 (mEq/L)   Potassium 4.0  3.5 - 5.1 (mEq/L)   Chloride 100  96 - 112 (mEq/L)   CO2 25  19 - 32 (mEq/L)   Glucose, Bld 234 (*) 70 - 99 (mg/dL)   BUN 17  6 - 23 (mg/dL)   Creatinine, Ser 5.62  0.50 - 1.10 (mg/dL)   Calcium 8.1 (*) 8.4 - 10.5 (mg/dL)   GFR calc non Af Amer 87 (*) >90 (mL/min)   GFR calc Af Amer >90  >90 (mL/min)  PROTIME-INR     Status: Abnormal   Collection Time   02/25/11  4:22 AM      Component Value Range   Prothrombin Time 20.8 (*) 11.6 - 15.2 (seconds)   INR 1.76 (*) 0.00 - 1.49   GLUCOSE, CAPILLARY     Status: Abnormal   Collection Time   02/25/11  7:25 AM      Component Value Range   Glucose-Capillary 209 (*) 70 - 99 (mg/dL)  PREPARE RBC (CROSSMATCH)  Status: Normal   Collection Time   02/25/11 11:30 AM      Component Value Range   Order Confirmation ORDER PROCESSED BY BLOOD BANK    GLUCOSE, CAPILLARY     Status: Abnormal   Collection Time   02/25/11 11:47 AM      Component Value Range   Glucose-Capillary 268 (*) 70 - 99 (mg/dL)  .   Micro Results: Recent Results (from the past 240 hour(s))  URINE CULTURE     Status: Normal   Collection Time   02/23/11 12:18 PM      Component Value Range Status Comment   Specimen Description URINE, CATHETERIZED   Final    Special Requests NONE   Final    Culture  Setup Time [id number]   Final    Colony Count NO GROWTH   Final    Culture NO GROWTH   Final    Report Status 02/24/2011 FINAL   Final     Studies/Results: Dg Chest 1 View  02/23/2011  *RADIOLOGY REPORT*  Clinical Data: Hip fracture, fall, weakness  CHEST - 1 VIEW  Comparison: None  Findings: Borderline cardiomegaly noted.  Status post median sternotomy. No pulmonary edema.  No diagnostic pneumothorax.  Right midlung linear atelectasis or infiltrate.  IMPRESSION: No pulmonary edema.  No diagnostic pneumothorax.  Cardiomegaly. Right midlung linear atelectasis or infiltrate.  Original Report Authenticated By: Natasha Mead, M.D.   Dg Hip Complete  Right  02/23/2011  *RADIOLOGY REPORT*  Clinical Data: Severe right hip pain status post fall today.  RIGHT HIP - COMPLETE 2+ VIEW  Comparison: None.  Findings: There is an acute intertrochanteric right femur fracture with resulting moderate varus angulation.  No dislocation or acute pelvic fracture is seen.  The patient is status post pinning of a subcapital fracture of the left femoral neck.  There is generalized osteopenia.  Scattered vascular calcifications are noted.  IMPRESSION: Angulated and mildly comminuted intertrochanteric right femur fracture.  Per CMS PQRS reporting requirements (PQRS Measure 24): Given the patient's age of greater than 50 and the fracture site (hip, distal radius, or spine), the patient should be tested for osteoporosis using DXA, and the appropriate treatment considered based on the DXA results.  Original Report Authenticated By: Gerrianne Scale, M.D.   Dg Femur Right  02/23/2011  *RADIOLOGY REPORT*  Clinical Data: 78 year old female undergoing right hip fixation.  RIGHT FEMUR - 2 VIEW  Comparison: Preoperative study from 1238 hours same day.  Findings: Four intraoperative fluoroscopic views of the right hip. Intramedullary rod has been placed with proximal interlocking dynamic hip screw traversing the comminuted intertrochanteric fracture.  Hardware appears intact.  Significantly improved alignment, near anatomic.  IMPRESSION: ORIF right femur without adverse features.  Original Report Authenticated By: Harley Hallmark, M.D.   Dg Hip Portable 1 View Right  02/23/2011  *RADIOLOGY REPORT*  Clinical Data: 78 year old female postop right hip.  PORTABLE RIGHT HIP - 1 VIEW  Comparison: 2108 hours the same day and earlier.  Findings: Portable views of the right femur.  Intramedullary rod with proximal interlocking dynamic hip screw.  Near anatomic alignment about the intertrochanteric fracture.  No adverse hardware features.  Calcified atherosclerosis.  Visible pelvis appears intact.  Expected soft tissue postoperative changes.  IMPRESSION: Right femur ORIF with no adverse features.  Original Report Authenticated By: Harley Hallmark, M.D.   Dg C-arm 1-60 Min-no Report  02/23/2011  CLINICAL DATA: fractured right hip   C-ARM 1-60 MINUTES  Fluoroscopy was utilized  by the requesting physician.  No radiographic  interpretation.     Medications: Scheduled Meds:   . amLODipine  2.5 mg Oral Daily  . brimonidine  1 drop Both Eyes BID  . carvedilol  12.5 mg Oral BID WC  .  ceFAZolin (ANCEF) IV  1 g Intravenous Q8H  . citalopram  20 mg Oral Daily  . coumadin book   Does not apply Once  . heparin  5,000 Units Subcutaneous Q8H  . insulin aspart  0-15 Units Subcutaneous TID WC  . insulin aspart  0-5 Units Subcutaneous QHS  . insulin aspart  3 Units Subcutaneous TID WC  . insulin glargine  20 Units Subcutaneous QHS  . isosorbide dinitrate  30 mg Oral QID  . levothyroxine  50 mcg Oral QAC breakfast  . pantoprazole  80 mg Oral Q1200  . phenytoin  100 mg Oral TID  . warfarin  2.5 mg Oral ONCE-1800  . warfarin   Does not apply Once   Continuous Infusions:   . sodium chloride 1,000 mL (02/25/11 0032)  . lactated ringers     PRN Meds:.acetaminophen, acetaminophen, menthol-cetylpyridinium, meperidine, methocarbamol(ROBAXIN) IV, methocarbamol, metoCLOPramide (REGLAN) injection, metoCLOPramide, ondansetron (ZOFRAN) IV, ondansetron, phenol, promethazine, senna-docusate, traMADol, DISCONTD: fentaNYL, DISCONTD: HYDROcodone-acetaminophen, DISCONTD: HYDROmorphone, DISCONTD: HYDROmorphone  Assessment/Plan: Patient Active Hospital Problem List: 1. 1. Fracture of right femur s/p surgery:  - per Ortho  2. Anemia of blood loss:  - s/p 1 U PRBC tx yesterday hgb is still 7. Will order 2 more units of prbc. Get a post transfusion H&H.  Anemia panel.  - Monitor H&H  3. Pain control:  4. DM: not well controlled. Will increase lantus to 28 units and add premeal coverage.  Continue with SSI 5.  Hypertension: suboptimal. Will adjust meds.  6. CAD; denies any chest pain or sob. Continue with the same treatment.  7. Hyponatremia: mild. Improved.  Asymptomatic. Will get further workup  8. Seizures: no seizure activity. Continue with dilantin. 9. Hypothyroidism: get tsh and continue with synthroid 10. Depression: no suicidal ideation. Continue with celexa. 12. Fever: get cxr. Ua negative.  11. DVT PPX:   LOS: 2 days   Dymin Dingledine 02/25/2011, 11:56 AM

## 2011-02-25 NOTE — Progress Notes (Signed)
ANTICOAGULATION CONSULT NOTE - Follow-Up Consult  Pharmacy Consult for Warfarin Indication: VTE prophylaxis  Allergies  Allergen Reactions  . Aspirin Other (See Comments)    dirrahea  . Sulfa Antibiotics Nausea And Vomiting    Patient Measurements: Height: 5\' 6"  (167.6 cm) Weight: 110 lb (49.896 kg) IBW/kg (Calculated) : 59.3    Vital Signs: Temp: 98.2 F (36.8 C) (02/09 0623) Temp src: Oral (02/09 0623) BP: 183/71 mmHg (02/09 0623) Pulse Rate: 89  (02/09 0623)  Labs:  Basename 02/25/11 0422 02/24/11 2300 02/24/11 0405 02/23/11 1130  HGB 7.1* 7.8* -- --  HCT 21.2* 22.5* 20.7* --  PLT 107* -- 116* 152  APTT -- -- -- --  LABPROT 20.8* -- 15.5* 15.2  INR 1.76* -- 1.20 1.18  HEPARINUNFRC -- -- -- --  CREATININE 0.53 -- 0.56 0.62  CKTOTAL -- -- -- --  CKMB -- -- -- --  TROPONINI -- -- -- --   Estimated Creatinine Clearance: 44.2 ml/min (by C-G formula based on Cr of 0.53).  Medical History: Past Medical History  Diagnosis Date  . Hypertension   . Diabetes mellitus   . Hypothyroid   . Stroke   . Seizures   . Arthritis   . Coronary artery disease   . Depression     Medications:  Prescriptions prior to admission  Medication Sig Dispense Refill  . acetaminophen (TYLENOL) 500 MG tablet Take 1,000 mg by mouth every 6 (six) hours as needed. For pain      . amLODipine (NORVASC) 2.5 MG tablet Take 2.5 mg by mouth daily.      . brimonidine (ALPHAGAN P) 0.1 % SOLN Place 1 drop into both eyes 2 (two) times daily.      . carvedilol (COREG) 12.5 MG tablet Take 12.5 mg by mouth 2 (two) times daily with a meal.      . citalopram (CELEXA) 20 MG tablet Take 20 mg by mouth daily.      Marland Kitchen esomeprazole (NEXIUM) 40 MG capsule Take 40 mg by mouth daily before breakfast.      . insulin glargine (LANTUS) 100 UNIT/ML injection Inject 7-30 Units into the skin at bedtime.      . isosorbide dinitrate (ISORDIL) 30 MG tablet Take 30 mg by mouth 4 (four) times daily.      Marland Kitchen levothyroxine  (SYNTHROID, LEVOTHROID) 50 MCG tablet Take 50 mcg by mouth daily.      . metFORMIN (GLUCOPHAGE) 500 MG tablet Take 1,000 mg by mouth 2 (two) times daily with a meal.      . nitrofurantoin, macrocrystal-monohydrate, (MACROBID) 100 MG capsule Take 100 mg by mouth 2 (two) times daily.      . phenytoin (DILANTIN) 100 MG ER capsule Take 100 mg by mouth 3 (three) times daily.      . potassium chloride (K-DUR,KLOR-CON) 10 MEQ tablet Take 10 mEq by mouth daily.      Marland Kitchen torsemide (DEMADEX) 10 MG tablet Take 10 mg by mouth daily.       Scheduled:     . amLODipine  2.5 mg Oral Daily  . brimonidine  1 drop Both Eyes BID  . carvedilol  12.5 mg Oral BID WC  .  ceFAZolin (ANCEF) IV  1 g Intravenous Q8H  . citalopram  20 mg Oral Daily  . coumadin book   Does not apply Once  . heparin  5,000 Units Subcutaneous Q8H  . insulin aspart  0-15 Units Subcutaneous TID WC  . insulin aspart  0-5 Units Subcutaneous QHS  . insulin aspart  3 Units Subcutaneous TID WC  . insulin glargine  28 Units Subcutaneous QHS  . isosorbide dinitrate  30 mg Oral QID  . levothyroxine  50 mcg Oral QAC breakfast  . pantoprazole  80 mg Oral Q1200  . phenytoin  100 mg Oral TID  . warfarin  2.5 mg Oral ONCE-1800  . warfarin   Does not apply Once  . DISCONTD: insulin glargine  20 Units Subcutaneous QHS   Infusions:     . sodium chloride 1,000 mL (02/25/11 0032)  . lactated ringers     PRN: acetaminophen, acetaminophen, menthol-cetylpyridinium, meperidine, methocarbamol(ROBAXIN) IV, methocarbamol, metoCLOPramide (REGLAN) injection, metoCLOPramide, ondansetron (ZOFRAN) IV, ondansetron, phenol, promethazine, senna-docusate, traMADol, DISCONTD: fentaNYL, DISCONTD: HYDROcodone-acetaminophen, DISCONTD: HYDROmorphone, DISCONTD: HYDROmorphone  Inpatient warfarin doses: 4mg  on 2/7, 2.5 mg on 2/8   Assessment:  78 yo F s/p nailing of femoral fx.   Warfarin initiation in progress for VTE prophylaxis  Warfarin maintenance dose  requirements could be small due to age, gender, small body habitus.  Concomitant phenytoin noted.  Also on prophylactic-dose SQ heparin  Hg still low after 1 unit PRBC yesterday, 2 more units today - f/u H/H post tranfusion   Goal of Therapy:  INR 2-3   Plan:   Warfarin 2 mg PO today if H/H okay after tranfusion  Recheck INR tomorrow AM.  Clydene Fake PharmD 2:42 PM 02/25/2011

## 2011-02-25 NOTE — Progress Notes (Signed)
Physical Therapy Treatment Patient Details Name: Courtney Pitts MRN: [id number] DOB: 1929/02/01 Today's Date: 02/25/2011  PT Assessment/Plan  PT - Assessment/Plan Comments on Treatment Session: Improvement noted in pt's level of alertness and ability to follow cues and participate with PT.  Pt ltd by fatigue with decreased Hgb and transfusion pending PT Plan: Discharge plan remains appropriate PT Frequency: Min 3X/week Recommendations for Other Services: OT consult Follow Up Recommendations: Skilled nursing facility Equipment Recommended: Defer to next venue PT Goals  Acute Rehab PT Goals PT Goal Formulation: With patient Time For Goal Achievement: 7 days Pt will go Supine/Side to Sit: with min assist Pt will go Sit to Supine/Side: with min assist PT Goal: Sit to Supine/Side - Progress: Progressing toward goal Pt will go Sit to Stand: with min assist PT Goal: Sit to Stand - Progress: Progressing toward goal Pt will go Stand to Sit: with min assist PT Goal: Stand to Sit - Progress: Progressing toward goal Pt will Ambulate: 16 - 50 feet;with mod assist;with rolling walker PT Goal: Ambulate - Progress: Progressing toward goal  PT Treatment Precautions/Restrictions  Precautions Precautions: Fall Required Braces or Orthoses: No Restrictions Weight Bearing Restrictions: Yes RLE Weight Bearing: Partial weight bearing RLE Partial Weight Bearing Percentage or Pounds: 25% Mobility (including Balance) Bed Mobility Bed Mobility: Yes Supine to Sit: 1: +1 Total assist Sit to Supine: 1: +2 Total assist Sit to Supine - Details (indicate cue type and reason): pt 25% with cues to self assist Transfers Sit to Stand: 1: +2 Total assist;From chair/3-in-1;With armrests;With upper extremity assist Sit to Stand Details (indicate cue type and reason): cues for use of UEs and for LE position to avoid increased WB Stand to Sit: 1: +2 Total assist;With upper extremity assist;To bed Stand to Sit  Details: cues for use of UEs and for LE position to avoid increased WB Stand Pivot Transfers: 1: +2 Total assist Stand Pivot Transfer Details (indicate cue type and reason): with RW, pt 50% with cues for sequence and to avoid increased WB on R Ambulation/Gait Ambulation/Gait:  (Stand pvt only)    Exercise    End of Session PT - End of Session Equipment Utilized During Treatment: Gait belt Activity Tolerance: Other (comment);Patient limited by fatigue Patient left: in bed;with call bell in reach Nurse Communication: Mobility status for transfers;Mobility status for ambulation General Behavior During Session: [facility] for tasks performed Cognition: Impaired  Courtney Pitts 02/25/2011, 3:38 PM

## 2011-02-26 LAB — TYPE AND SCREEN
Unit division: 0
Unit division: 0

## 2011-02-26 LAB — GLUCOSE, CAPILLARY
Glucose-Capillary: 238 mg/dL — ABNORMAL HIGH (ref 70–99)
Glucose-Capillary: 225 mg/dL — ABNORMAL HIGH (ref 70–99)
Glucose-Capillary: 244 mg/dL — ABNORMAL HIGH (ref 70–99)
Glucose-Capillary: 155 mg/dL — ABNORMAL HIGH (ref 70–99)

## 2011-02-26 LAB — CBC
HCT: 27.5 % — ABNORMAL LOW (ref 36.0–46.0)
Hemoglobin: 9.8 g/dL — ABNORMAL LOW (ref 12.0–15.0)
MCH: 29.5 pg (ref 26.0–34.0)
MCHC: 35.3 g/dL (ref 30.0–36.0)
MCV: 83.6 fL (ref 78.0–100.0)
RBC: 3.29 MIL/uL — ABNORMAL LOW (ref 3.87–5.11)

## 2011-02-26 LAB — BASIC METABOLIC PANEL
BUN: 16 mg/dL (ref 6–23)
CO2: 24 mEq/L (ref 19–32)
Chloride: 99 mEq/L (ref 96–112)
Creatinine, Ser: 0.56 mg/dL (ref 0.50–1.10)
GFR calc Af Amer: >90 mL/min (ref >90)
Glucose, Bld: 241 mg/dL — ABNORMAL HIGH (ref 70–99)
Potassium: 4.4 mEq/L (ref 3.5–5.1)

## 2011-02-26 LAB — PROTIME-INR: Prothrombin Time: 21.1 seconds — ABNORMAL HIGH (ref 11.6–15.2)

## 2011-02-26 MED ORDER — AMLODIPINE BESYLATE 5 MG PO TABS
5.0000 mg | ORAL_TABLET | Freq: Every day | ORAL | Status: DC
  Administered 2011-02-27 – 2011-02-28 (×2): 5 mg via ORAL
  Filled 2011-02-26 (×3): qty 1

## 2011-02-26 MED ORDER — ISOSORBIDE DINITRATE 20 MG PO TABS
40.0000 mg | ORAL_TABLET | Freq: Four times a day (QID) | ORAL | Status: DC
  Administered 2011-02-26 – 2011-02-28 (×9): 40 mg via ORAL
  Filled 2011-02-26 (×15): qty 2

## 2011-02-26 MED ORDER — INSULIN GLARGINE 100 UNIT/ML ~~LOC~~ SOLN
32.0000 [IU] | Freq: Every day | SUBCUTANEOUS | Status: DC
  Administered 2011-02-26 – 2011-02-27 (×2): 32 [IU] via SUBCUTANEOUS

## 2011-02-26 MED ORDER — WARFARIN SODIUM 4 MG PO TABS
4.0000 mg | ORAL_TABLET | Freq: Once | ORAL | Status: AC
  Administered 2011-02-26: 4 mg via ORAL
  Filled 2011-02-26: qty 1

## 2011-02-26 MED ORDER — INSULIN ASPART 100 UNIT/ML ~~LOC~~ SOLN
5.0000 [IU] | Freq: Three times a day (TID) | SUBCUTANEOUS | Status: DC
  Administered 2011-02-27 – 2011-02-28 (×6): 5 [IU] via SUBCUTANEOUS

## 2011-02-26 NOTE — Progress Notes (Signed)
ANTICOAGULATION CONSULT NOTE - Follow-Up Consult  Pharmacy Consult for Warfarin Indication: VTE prophylaxis  Allergies  Allergen Reactions  . Aspirin Other (See Comments)    dirrahea  . Sulfa Antibiotics Nausea And Vomiting   Patient Measurements: Height: 5\' 6"  (167.6 cm) Weight: 110 lb (49.896 kg) IBW/kg (Calculated) : 59.3   Vital Signs: Temp: 98.7 F (37.1 C) (02/10 0535) Temp src: Oral (02/10 0535) BP: 200/78 mmHg (02/10 0800) Pulse Rate: 88  (02/10 0800)  Labs:  Basename 02/26/11 0445 02/25/11 0422 02/24/11 2300 02/24/11 0405  HGB 9.8* 7.1* -- --  HCT 27.5* 21.2* 22.5* --  PLT 99* 107* -- 116*  APTT -- -- -- --  LABPROT 21.1* 20.8* -- 15.5*  INR 1.79* 1.76* -- 1.20  HEPARINUNFRC -- -- -- --  CREATININE 0.56 0.53 -- 0.56  CKTOTAL -- -- -- --  CKMB -- -- -- --  TROPONINI -- -- -- --   Estimated Creatinine Clearance: 44.2 ml/min (by C-G formula based on Cr of 0.56).  Medical History: Past Medical History  Diagnosis Date  . Hypertension   . Diabetes mellitus   . Hypothyroid   . Stroke   . Seizures   . Arthritis   . Coronary artery disease   . Depression    Medications:  Prescriptions prior to admission  Medication Sig Dispense Refill  . acetaminophen (TYLENOL) 500 MG tablet Take 1,000 mg by mouth every 6 (six) hours as needed. For pain      . amLODipine (NORVASC) 2.5 MG tablet Take 2.5 mg by mouth daily.      . brimonidine (ALPHAGAN P) 0.1 % SOLN Place 1 drop into both eyes 2 (two) times daily.      . carvedilol (COREG) 12.5 MG tablet Take 12.5 mg by mouth 2 (two) times daily with a meal.      . citalopram (CELEXA) 20 MG tablet Take 20 mg by mouth daily.      Marland Kitchen esomeprazole (NEXIUM) 40 MG capsule Take 40 mg by mouth daily before breakfast.      . insulin glargine (LANTUS) 100 UNIT/ML injection Inject 7-30 Units into the skin at bedtime.      . isosorbide dinitrate (ISORDIL) 30 MG tablet Take 30 mg by mouth 4 (four) times daily.      Marland Kitchen levothyroxine  (SYNTHROID, LEVOTHROID) 50 MCG tablet Take 50 mcg by mouth daily.      . metFORMIN (GLUCOPHAGE) 500 MG tablet Take 1,000 mg by mouth 2 (two) times daily with a meal.      . nitrofurantoin, macrocrystal-monohydrate, (MACROBID) 100 MG capsule Take 100 mg by mouth 2 (two) times daily.      . phenytoin (DILANTIN) 100 MG ER capsule Take 100 mg by mouth 3 (three) times daily.      . potassium chloride (K-DUR,KLOR-CON) 10 MEQ tablet Take 10 mEq by mouth daily.      Marland Kitchen torsemide (DEMADEX) 10 MG tablet Take 10 mg by mouth daily.       Scheduled:     . amLODipine  2.5 mg Oral Daily  . brimonidine  1 drop Both Eyes BID  . carvedilol  12.5 mg Oral BID WC  . citalopram  20 mg Oral Daily  . heparin  5,000 Units Subcutaneous Q8H  . insulin aspart  0-15 Units Subcutaneous TID WC  . insulin aspart  0-5 Units Subcutaneous QHS  . insulin aspart  3 Units Subcutaneous TID WC  . insulin glargine  28 Units Subcutaneous QHS  .  isosorbide dinitrate  30 mg Oral QID  . levothyroxine  50 mcg Oral QAC breakfast  . moxifloxacin  400 mg Intravenous q1800  . pantoprazole  80 mg Oral Q1200  . phenytoin  100 mg Oral TID  . warfarin  2 mg Oral ONCE-1800  . DISCONTD: insulin glargine  20 Units Subcutaneous QHS   Infusions:     . sodium chloride 50 mL/hr at 02/25/11 1645  . DISCONTD: sodium chloride 1,000 mL (02/25/11 0032)  . DISCONTD: sodium chloride    . DISCONTD: lactated ringers 125 mL/hr at 02/25/11 1611   PRN: acetaminophen, acetaminophen, menthol-cetylpyridinium, meperidine, methocarbamol(ROBAXIN) IV, methocarbamol, metoCLOPramide (REGLAN) injection, metoCLOPramide, ondansetron (ZOFRAN) IV, ondansetron, phenol, promethazine, senna-docusate, traMADol  Inpatient warfarin doses: 4mg , 2.5mg , 2mg ,  Assessment:  78 yo F s/p nailing of femoral fx.   Warfarin maintenance dose requirements could be small due to age, gender, small size, concomitant phenytoin.  INR intially rose quickly, now  leveling-off.  Also on prophylactic-dose SQ heparin.  Avelox can increase INR response.  Hgb improved after transfusions.   No bleeding reported/documented.  Goal of Therapy:  INR 2-3   Plan:   Warfarin 4mg  PO today.  INR daily.   Reece Packer PharmD 10:31 AM 02/26/2011

## 2011-02-26 NOTE — Progress Notes (Signed)
CSW spoke with patient re: discharge planning. Noted PT recommending ST-SNF. Patient states she had been to [facility] in the past and would like to go there for rehab. CSW completed FL2, faxed out to [facility] & will follow-up Monday re: bed availability. Patient had been living at home with her husband, [name] (home#: [phone] cell#: [phone]).   Courtney Pitts, LCSWA [phone]

## 2011-02-26 NOTE — Progress Notes (Signed)
Subjective: No new complaints.  Objective: Weight change:   Intake/Output Summary (Last 24 hours) at 02/26/11 2059 Last data filed at 02/26/11 1843  Gross per 24 hour  Intake 3271.39 ml  Output    715 ml  Net 2556.39 ml    Filed Vitals:   02/26/11 1829  BP: 111/74  Pulse: 76  Temp:   Resp:    Alert and oriented  Cardiovascular: RRR, S1 normal, S2 normal, no MRG, pulses symmetric and intact bilaterally  Pulmonary/Chest: CTAB, no wheezes, rales, Scattered rhonchi  Abdominal: Soft. Non-tender, non-distended, bowel sounds are normal, no masses, organomegaly, or guarding present.  Ext: no edema and no cyanosis, pulses palpable bilaterally  Right hip tenderness improved.     Lab Results: Results for orders placed during the hospital encounter of 02/23/11 (from the past 24 hour(s))  GLUCOSE, CAPILLARY     Status: Abnormal   Collection Time   02/25/11 10:24 PM      Component Value Range   Glucose-Capillary 320 (*) 70 - 99 (mg/dL)   Comment 1 Documented in Chart     Comment 2 Notify RN    CBC     Status: Abnormal   Collection Time   02/26/11  4:45 AM      Component Value Range   WBC 7.5  4.0 - 10.5 (K/uL)   RBC 3.29 (*) 3.87 - 5.11 (MIL/uL)   Hemoglobin 9.8 (*) 12.0 - 15.0 (g/dL)   HCT 16.1 (*) 09.6 - 46.0 (%)   MCV 83.6  78.0 - 100.0 (fL)   MCH 29.5  26.0 - 34.0 (pg)   MCHC 35.3  30.0 - 36.0 (g/dL)   RDW 04.5  40.9 - 81.1 (%)   Platelets 99 (*) 150 - 400 (K/uL)  PROTIME-INR     Status: Abnormal   Collection Time   02/26/11  4:45 AM      Component Value Range   Prothrombin Time 21.1 (*) 11.6 - 15.2 (seconds)   INR 1.79 (*) 0.00 - 1.49   BASIC METABOLIC PANEL     Status: Abnormal   Collection Time   02/26/11  4:45 AM      Component Value Range   Sodium 132 (*) 135 - 145 (mEq/L)   Potassium 4.4  3.5 - 5.1 (mEq/L)   Chloride 99  96 - 112 (mEq/L)   CO2 24  19 - 32 (mEq/L)   Glucose, Bld 241 (*) 70 - 99 (mg/dL)   BUN 16  6 - 23 (mg/dL)   Creatinine, Ser 9.14  0.50 -  1.10 (mg/dL)   Calcium 8.3 (*) 8.4 - 10.5 (mg/dL)   GFR calc non Af Amer 86 (*) >90 (mL/min)   GFR calc Af Amer >90  >90 (mL/min)  GLUCOSE, CAPILLARY     Status: Abnormal   Collection Time   02/26/11  7:15 AM      Component Value Range   Glucose-Capillary 238 (*) 70 - 99 (mg/dL)   Comment 1 Notify RN    GLUCOSE, CAPILLARY     Status: Abnormal   Collection Time   02/26/11 12:37 PM      Component Value Range   Glucose-Capillary 225 (*) 70 - 99 (mg/dL)   Comment 1 Notify RN    GLUCOSE, CAPILLARY     Status: Abnormal   Collection Time   02/26/11  5:37 PM      Component Value Range   Glucose-Capillary 244 (*) 70 - 99 (mg/dL)    Micro Results: Recent  Results (from the past 240 hour(s))  URINE CULTURE     Status: Normal   Collection Time   02/23/11 12:18 PM      Component Value Range Status Comment   Specimen Description URINE, CATHETERIZED   Final    Special Requests NONE   Final    Culture  Setup Time [id number]   Final    Colony Count NO GROWTH   Final    Culture NO GROWTH   Final    Report Status 02/24/2011 FINAL   Final     Studies/Results: Dg Chest 1 View  02/25/2011  *RADIOLOGY REPORT*  Clinical Data: Fever  CHEST - 1 VIEW  Comparison: 02/23/2011  Findings: Mild patchy opacity in the right mid lung, grossly unchanged suspicious for pneumonia.  Suspected small left pleural effusion with associated new left lower lobe opacity, atelectasis versus pneumonia.  The heart is top normal in size. Postsurgical changes related to prior CABG.  IMPRESSION: Mild patchy opacity in the right mid lung, suspicious for pneumonia.  Atelectasis versus pneumonia in the left lower lobe with associated small left pleural effusion.  Original Report Authenticated By: Charline Bills, M.D.   Dg Chest 1 View  02/23/2011  *RADIOLOGY REPORT*  Clinical Data: Hip fracture, fall, weakness  CHEST - 1 VIEW  Comparison: None  Findings: Borderline cardiomegaly noted.  Status post median sternotomy. No pulmonary  edema.  No diagnostic pneumothorax.  Right midlung linear atelectasis or infiltrate.  IMPRESSION: No pulmonary edema.  No diagnostic pneumothorax.  Cardiomegaly. Right midlung linear atelectasis or infiltrate.  Original Report Authenticated By: Natasha Mead, M.D.   Dg Hip Complete Right  02/23/2011  *RADIOLOGY REPORT*  Clinical Data: Severe right hip pain status post fall today.  RIGHT HIP - COMPLETE 2+ VIEW  Comparison: None.  Findings: There is an acute intertrochanteric right femur fracture with resulting moderate varus angulation.  No dislocation or acute pelvic fracture is seen.  The patient is status post pinning of a subcapital fracture of the left femoral neck.  There is generalized osteopenia.  Scattered vascular calcifications are noted.  IMPRESSION: Angulated and mildly comminuted intertrochanteric right femur fracture.  Per CMS PQRS reporting requirements (PQRS Measure 24): Given the patient's age of greater than 50 and the fracture site (hip, distal radius, or spine), the patient should be tested for osteoporosis using DXA, and the appropriate treatment considered based on the DXA results.  Original Report Authenticated By: Gerrianne Scale, M.D.   Dg Femur Right  02/23/2011  *RADIOLOGY REPORT*  Clinical Data: 78 year old female undergoing right hip fixation.  RIGHT FEMUR - 2 VIEW  Comparison: Preoperative study from 1238 hours same day.  Findings: Four intraoperative fluoroscopic views of the right hip. Intramedullary rod has been placed with proximal interlocking dynamic hip screw traversing the comminuted intertrochanteric fracture.  Hardware appears intact.  Significantly improved alignment, near anatomic.  IMPRESSION: ORIF right femur without adverse features.  Original Report Authenticated By: Harley Hallmark, M.D.   Dg Hip Portable 1 View Right  02/23/2011  *RADIOLOGY REPORT*  Clinical Data: 78 year old female postop right hip.  PORTABLE RIGHT HIP - 1 VIEW  Comparison: 2108 hours the same day  and earlier.  Findings: Portable views of the right femur.  Intramedullary rod with proximal interlocking dynamic hip screw.  Near anatomic alignment about the intertrochanteric fracture.  No adverse hardware features.  Calcified atherosclerosis.  Visible pelvis appears intact. Expected soft tissue postoperative changes.  IMPRESSION: Right femur ORIF with no adverse features.  Original Report Authenticated By: Harley Hallmark, M.D.   Dg C-arm 1-60 Min-no Report  02/23/2011  CLINICAL DATA: fractured right hip   C-ARM 1-60 MINUTES  Fluoroscopy was utilized by the requesting physician.  No radiographic  interpretation.     Medications: Scheduled Meds:   . amLODipine  5 mg Oral Daily  . brimonidine  1 drop Both Eyes BID  . carvedilol  12.5 mg Oral BID WC  . citalopram  20 mg Oral Daily  . heparin  5,000 Units Subcutaneous Q8H  . insulin aspart  0-15 Units Subcutaneous TID WC  . insulin aspart  0-5 Units Subcutaneous QHS  . insulin aspart  5 Units Subcutaneous TID WC  . insulin glargine  32 Units Subcutaneous QHS  . isosorbide dinitrate  40 mg Oral QID  . levothyroxine  50 mcg Oral QAC breakfast  . moxifloxacin  400 mg Intravenous q1800  . pantoprazole  80 mg Oral Q1200  . phenytoin  100 mg Oral TID  . warfarin  4 mg Oral ONCE-1800  . DISCONTD: amLODipine  2.5 mg Oral Daily  . DISCONTD: insulin aspart  3 Units Subcutaneous TID WC  . DISCONTD: insulin glargine  28 Units Subcutaneous QHS  . DISCONTD: isosorbide dinitrate  30 mg Oral QID   Continuous Infusions:   . sodium chloride 1,000 mL (02/26/11 2035)   PRN Meds:.acetaminophen, acetaminophen, menthol-cetylpyridinium, meperidine, methocarbamol(ROBAXIN) IV, methocarbamol, metoCLOPramide (REGLAN) injection, metoCLOPramide, ondansetron (ZOFRAN) IV, ondansetron, phenol, promethazine, senna-docusate, traMADol  Assessment/Plan: . 1. Fracture of right femur s/p surgery:  -  Fur there tx as per ortho. per Ortho  2. Anemia of blood loss:  - s/p  3 U PRBC. Monitor H&H  3. Pain control:  4. DM: not well controlled. Will increase lantus to 32 units and increase premeal coverage.  Continue with SSI  5. Hypertension: suboptimal. Will adjust meds.  6. CAD; denies any chest pain or sob. Continue with the same treatment.  7. Hyponatremia: mild. Improved. Asymptomatic. Will get further workup  8. Seizures: no seizure activity. Continue with dilantin.  9. Hypothyroidism: tsh within normal limits and continue with synthroid  10. Depression: no suicidal ideation. Continue with celexa.  12. Fever:/ possible early pneumonia : day 2 of avelox.  13. Disposition: possible d/c in am to snf rehab.   11. DVT PPX:    LOS: 3 days   Courtney Pitts 02/26/2011, 8:59 PM

## 2011-02-26 NOTE — Progress Notes (Signed)
Physical Therapy Treatment Patient Details Name: Courtney Pitts MRN: [id number] DOB: [birth year] Today's Date: 02/26/2011  PT Assessment/Plan  PT - Assessment/Plan Comments on Treatment Session: pt ablr to stand at [facility] with 2 assist. pivoted by turning pt. while standing PT Plan: Discharge plan remains appropriate PT Frequency: Min 3X/week Recommendations for Other Services: OT consult Follow Up Recommendations: Skilled nursing facility Equipment Recommended: Defer to next venue PT Goals  Acute Rehab PT Goals PT Goal Formulation: With patient Time For Goal Achievement: 7 days Pt will go Supine/Side to Sit: with min assist PT Goal: Supine/Side to Sit - Progress: Progressing toward goal Pt will go Sit to Stand: with min assist PT Goal: Sit to Stand - Progress: Progressing toward goal Pt will go Stand to Sit: with min assist PT Goal: Stand to Sit - Progress: Progressing toward goal Pt will Transfer Bed to Chair/Chair to Bed: with mod assist PT Transfer Goal: Bed to Chair/Chair to Bed - Progress: Progressing toward goal Pt will Ambulate: 16 - 50 feet;with mod assist;with rolling walker PT Goal: Ambulate - Progress: Progressing toward goal  PT Treatment Precautions/Restrictions  Precautions Precautions: Fall Required Braces or Orthoses: No Restrictions Weight Bearing Restrictions: Yes RLE Weight Bearing: Partial weight bearing RLE Partial Weight Bearing Percentage or Pounds: 25% Mobility (including Balance) Bed Mobility Supine to Sit: 1: +2 Total assist Supine to Sit Details (indicate cue type and reason): used pad to turn pt to  EOB, once at edge pt held balance Transfers Sit to Stand: From bed;With upper extremity assist;1: +2 Total assist Sit to Stand Details (indicate cue type and reason): pt was able to stand at [facility] with assist, pt did not place weight on RLE able to rotate pt to recliner using RW Stand to Sit: 1: +2 Total assist;With upper extremity assist;To bed Stand to  Sit Details: pt lowered to recliner Stand Pivot Transfers: 1: +2 Total assist (pt=40) Stand Pivot Transfer Details (indicate cue type and reason): pt pivoted with RW pt did stay standing and did not place weight on RLE Ambulation/Gait Ambulation/Gait: No    Exercise    End of Session PT - End of Session Activity Tolerance: Patient tolerated treatment well Patient left: in chair;with call bell in reach Nurse Communication: Mobility status for transfers General Behavior During Session: [facility] for tasks performed Cognition: Impaired  Courtney Pitts 02/26/2011, 2:25 PM

## 2011-02-27 LAB — CBC
MCV: 83.1 fL (ref 78.0–100.0)
Platelets: 134 10*3/uL — ABNORMAL LOW (ref 150–400)
RDW: 15.4 % (ref 11.5–15.5)
WBC: 7.1 10*3/uL (ref 4.0–10.5)

## 2011-02-27 LAB — GLUCOSE, CAPILLARY
Glucose-Capillary: 53 mg/dL — ABNORMAL LOW (ref 70–99)
Glucose-Capillary: 168 mg/dL — ABNORMAL HIGH (ref 70–99)
Glucose-Capillary: 171 mg/dL — ABNORMAL HIGH (ref 70–99)

## 2011-02-27 LAB — BASIC METABOLIC PANEL
BUN: 17 mg/dL (ref 6–23)
Chloride: 97 mEq/L (ref 96–112)
Chloride: 97 mEq/L (ref 96–112)
Creatinine, Ser: 0.55 mg/dL (ref 0.50–1.10)
GFR calc Af Amer: >90 mL/min (ref >90)
GFR calc Af Amer: >90 mL/min (ref >90)
Potassium: 4.8 mEq/L (ref 3.5–5.1)

## 2011-02-27 LAB — PROTIME-INR: Prothrombin Time: 22.2 seconds — ABNORMAL HIGH (ref 11.6–15.2)

## 2011-02-27 MED ORDER — WARFARIN SODIUM 4 MG PO TABS
4.0000 mg | ORAL_TABLET | Freq: Once | ORAL | Status: AC
  Administered 2011-02-27: 4 mg via ORAL
  Filled 2011-02-27: qty 1

## 2011-02-27 MED ORDER — LORATADINE 10 MG PO TABS
10.0000 mg | ORAL_TABLET | Freq: Every day | ORAL | Status: DC
  Administered 2011-02-27 – 2011-02-28 (×2): 10 mg via ORAL
  Filled 2011-02-27 (×3): qty 1

## 2011-02-27 NOTE — Progress Notes (Signed)
Subjective: Pt stable   Objective: Vital signs in last 24 hours: Temp:  [97.9 F (36.6 C)-98.1 F (36.7 C)] 98 F (36.7 C) (02/11 0527) Pulse Rate:  [66-88] 75  (02/11 0527) Resp:  [16-18] 18  (02/11 1200) BP: (111-178)/(60-78) 178/69 mmHg (02/11 0527) SpO2:  [95 %-100 %] 96 % (02/11 1200)  Intake/Output from previous day: 02/10 0701 - 02/11 0700 In: 2689.2 [P.O.:1500; I.V.:1189.2] Out: 1190 [Urine:1190] Intake/Output this shift: Total I/O In: 240 [P.O.:240] Out: -   Exam:  Sensation intact distally Intact pulses distally Dorsiflexion/Plantar flexion intact  Labs:  Basename 02/27/11 0410 02/26/11 0445 02/25/11 0422 02/24/11 2300  HGB 8.5* 9.8* 7.1* 7.8*    Basename 02/27/11 0410 02/26/11 0445  WBC 7.1 7.5  RBC 2.96* 3.29*  HCT 24.6* 27.5*  PLT 134* 99*    Basename 02/27/11 1206 02/27/11 0410  NA 126* 126*  K 4.8 4.1  CL 97 97  CO2 23 23  BUN 17 15  CREATININE 0.62 0.55  GLUCOSE 177* 86  CALCIUM 7.5* 8.0*    Basename 02/27/11 0410 02/26/11 0445  LABPT -- --  INR 1.91* 1.79*    Assessment/Plan: Pt stable - less pain today with range of motion - change dressing - cpm - ok for transfer to snf if hgb remains stable am - cont pwb RLE   DEAN,GREGORY SCOTT 02/27/2011, 1:03 PM

## 2011-02-27 NOTE — Progress Notes (Signed)
Subjective: Pt reports that she is catching a cold. She has post nasal drip.  Her pain is controlled.   Objective: Weight change:   Intake/Output Summary (Last 24 hours) at 02/27/11 1846 Last data filed at 02/27/11 1400  Gross per 24 hour  Intake   1980 ml  Output   1250 ml  Net    730 ml    Filed Vitals:   02/27/11 1600  BP:   Pulse:   Temp:   Resp: 16   On exam  She is alert afebrile and comfortable CVS S1S2  Lungs : decreased at bases Abdomen: soft  NT, ND BS+ Extremities: No pedal edema cyanosis or clubbing.   Lab Results: Results for orders placed during the hospital encounter of 02/23/11 (from the past 24 hour(s))  GLUCOSE, CAPILLARY     Status: Abnormal   Collection Time   02/26/11 10:06 PM      Component Value Range   Glucose-Capillary 155 (*) 70 - 99 (mg/dL)   Comment 1 Documented in Chart     Comment 2 Notify RN    PROTIME-INR     Status: Abnormal   Collection Time   02/27/11  4:10 AM      Component Value Range   Prothrombin Time 22.2 (*) 11.6 - 15.2 (seconds)   INR 1.91 (*) 0.00 - 1.49   CBC     Status: Abnormal   Collection Time   02/27/11  4:10 AM      Component Value Range   WBC 7.1  4.0 - 10.5 (K/uL)   RBC 2.96 (*) 3.87 - 5.11 (MIL/uL)   Hemoglobin 8.5 (*) 12.0 - 15.0 (g/dL)   HCT 16.1 (*) 09.6 - 46.0 (%)   MCV 83.1  78.0 - 100.0 (fL)   MCH 28.7  26.0 - 34.0 (pg)   MCHC 34.6  30.0 - 36.0 (g/dL)   RDW 04.5  40.9 - 81.1 (%)   Platelets 134 (*) 150 - 400 (K/uL)  BASIC METABOLIC PANEL     Status: Abnormal   Collection Time   02/27/11  4:10 AM      Component Value Range   Sodium 126 (*) 135 - 145 (mEq/L)   Potassium 4.1  3.5 - 5.1 (mEq/L)   Chloride 97  96 - 112 (mEq/L)   CO2 23  19 - 32 (mEq/L)   Glucose, Bld 86  70 - 99 (mg/dL)   BUN 15  6 - 23 (mg/dL)   Creatinine, Ser 9.14  0.50 - 1.10 (mg/dL)   Calcium 8.0 (*) 8.4 - 10.5 (mg/dL)   GFR calc non Af Amer 86 (*) >90 (mL/min)   GFR calc Af Amer >90  >90 (mL/min)  GLUCOSE, CAPILLARY      Status: Abnormal   Collection Time   02/27/11  7:10 AM      Component Value Range   Glucose-Capillary 53 (*) 70 - 99 (mg/dL)   Comment 1 Notify RN     Comment 2 Documented in Chart    GLUCOSE, CAPILLARY     Status: Abnormal   Collection Time   02/27/11  8:15 AM      Component Value Range   Glucose-Capillary 127 (*) 70 - 99 (mg/dL)   Comment 1 Notify RN     Comment 2 Documented in Chart    OCCULT BLOOD X 1 CARD TO LAB, STOOL     Status: Normal   Collection Time   02/27/11 11:25 AM      Component  Value Range   Fecal Occult Bld NEGATIVE    GLUCOSE, CAPILLARY     Status: Abnormal   Collection Time   02/27/11 11:56 AM      Component Value Range   Glucose-Capillary 168 (*) 70 - 99 (mg/dL)   Comment 1 Notify RN     Comment 2 Documented in Chart    BASIC METABOLIC PANEL     Status: Abnormal   Collection Time   02/27/11 12:06 PM      Component Value Range   Sodium 126 (*) 135 - 145 (mEq/L)   Potassium 4.8  3.5 - 5.1 (mEq/L)   Chloride 97  96 - 112 (mEq/L)   CO2 23  19 - 32 (mEq/L)   Glucose, Bld 177 (*) 70 - 99 (mg/dL)   BUN 17  6 - 23 (mg/dL)   Creatinine, Ser 1.47  0.50 - 1.10 (mg/dL)   Calcium 7.5 (*) 8.4 - 10.5 (mg/dL)   GFR calc non Af Amer 83 (*) >90 (mL/min)   GFR calc Af Amer >90  >90 (mL/min)  GLUCOSE, CAPILLARY     Status: Abnormal   Collection Time   02/27/11  4:44 PM      Component Value Range   Glucose-Capillary 171 (*) 70 - 99 (mg/dL)   Comment 1 Documented in Chart     Comment 2 Notify RN    ;ts  Micro Results: Recent Results (from the past 240 hour(s))  URINE CULTURE     Status: Normal   Collection Time   02/23/11 12:18 PM      Component Value Range Status Comment   Specimen Description URINE, CATHETERIZED   Final    Special Requests NONE   Final    Culture  Setup Time [id number]   Final    Colony Count NO GROWTH   Final    Culture NO GROWTH   Final    Report Status 02/24/2011 FINAL   Final     Studies/Results: Dg Chest 1 View  02/25/2011  *RADIOLOGY  REPORT*  Clinical Data: Fever  CHEST - 1 VIEW  Comparison: 02/23/2011  Findings: Mild patchy opacity in the right mid lung, grossly unchanged suspicious for pneumonia.  Suspected small left pleural effusion with associated new left lower lobe opacity, atelectasis versus pneumonia.  The heart is top normal in size. Postsurgical changes related to prior CABG.  IMPRESSION: Mild patchy opacity in the right mid lung, suspicious for pneumonia.  Atelectasis versus pneumonia in the left lower lobe with associated small left pleural effusion.  Original Report Authenticated By: Charline Bills, M.D.   Dg Chest 1 View  02/23/2011  *RADIOLOGY REPORT*  Clinical Data: Hip fracture, fall, weakness  CHEST - 1 VIEW  Comparison: None  Findings: Borderline cardiomegaly noted.  Status post median sternotomy. No pulmonary edema.  No diagnostic pneumothorax.  Right midlung linear atelectasis or infiltrate.  IMPRESSION: No pulmonary edema.  No diagnostic pneumothorax.  Cardiomegaly. Right midlung linear atelectasis or infiltrate.  Original Report Authenticated By: Natasha Mead, M.D.   Dg Hip Complete Right  02/23/2011  *RADIOLOGY REPORT*  Clinical Data: Severe right hip pain status post fall today.  RIGHT HIP - COMPLETE 2+ VIEW  Comparison: None.  Findings: There is an acute intertrochanteric right femur fracture with resulting moderate varus angulation.  No dislocation or acute pelvic fracture is seen.  The patient is status post pinning of a subcapital fracture of the left femoral neck.  There is generalized osteopenia.  Scattered vascular calcifications are  noted.  IMPRESSION: Angulated and mildly comminuted intertrochanteric right femur fracture.  Per CMS PQRS reporting requirements (PQRS Measure 24): Given the patient's age of greater than 50 and the fracture site (hip, distal radius, or spine), the patient should be tested for osteoporosis using DXA, and the appropriate treatment considered based on the DXA results.  Original  Report Authenticated By: Gerrianne Scale, M.D.   Dg Femur Right  02/23/2011  *RADIOLOGY REPORT*  Clinical Data: 78 year old female undergoing right hip fixation.  RIGHT FEMUR - 2 VIEW  Comparison: Preoperative study from 1238 hours same day.  Findings: Four intraoperative fluoroscopic views of the right hip. Intramedullary rod has been placed with proximal interlocking dynamic hip screw traversing the comminuted intertrochanteric fracture.  Hardware appears intact.  Significantly improved alignment, near anatomic.  IMPRESSION: ORIF right femur without adverse features.  Original Report Authenticated By: Harley Hallmark, M.D.   Dg Hip Portable 1 View Right  02/23/2011  *RADIOLOGY REPORT*  Clinical Data: 78 year old female postop right hip.  PORTABLE RIGHT HIP - 1 VIEW  Comparison: 2108 hours the same day and earlier.  Findings: Portable views of the right femur.  Intramedullary rod with proximal interlocking dynamic hip screw.  Near anatomic alignment about the intertrochanteric fracture.  No adverse hardware features.  Calcified atherosclerosis.  Visible pelvis appears intact. Expected soft tissue postoperative changes.  IMPRESSION: Right femur ORIF with no adverse features.  Original Report Authenticated By: Harley Hallmark, M.D.   Dg C-arm 1-60 Min-no Report  02/23/2011  CLINICAL DATA: fractured right hip   C-ARM 1-60 MINUTES  Fluoroscopy was utilized by the requesting physician.  No radiographic  interpretation.     Medications: Scheduled Meds:   . amLODipine  5 mg Oral Daily  . brimonidine  1 drop Both Eyes BID  . carvedilol  12.5 mg Oral BID WC  . citalopram  20 mg Oral Daily  . insulin aspart  0-15 Units Subcutaneous TID WC  . insulin aspart  0-5 Units Subcutaneous QHS  . insulin aspart  5 Units Subcutaneous TID WC  . insulin glargine  32 Units Subcutaneous QHS  . isosorbide dinitrate  40 mg Oral QID  . levothyroxine  50 mcg Oral QAC breakfast  . loratadine  10 mg Oral Daily  .  moxifloxacin  400 mg Intravenous q1800  . pantoprazole  80 mg Oral Q1200  . phenytoin  100 mg Oral TID  . warfarin  4 mg Oral ONCE-1800  . DISCONTD: heparin  5,000 Units Subcutaneous Q8H  . DISCONTD: insulin aspart  3 Units Subcutaneous TID WC  . DISCONTD: insulin glargine  28 Units Subcutaneous QHS   Continuous Infusions:   . DISCONTD: sodium chloride 1,000 mL (02/26/11 2035)   PRN Meds:.acetaminophen, acetaminophen, menthol-cetylpyridinium, meperidine, methocarbamol(ROBAXIN) IV, methocarbamol, metoCLOPramide (REGLAN) injection, metoCLOPramide, ondansetron (ZOFRAN) IV, ondansetron, phenol, promethazine, senna-docusate, traMADol  Assessment/Plan: 1. Fracture of right femur s/p surgery:  - Further  tx as per ortho.  2. Anemia of blood loss:  - s/p 3 U PRBC. Monitor H&H Asper the son, he saw some bright blood per rectum prior to admission. Her hemoglobin dropped from the hgb of 11 to 7.1 after surgery. We are assuming the drop in hemoglobin probably secondary to blood loss from surgery. But this new h/o BRBpr, will be worked up. The stool for occult blood is negative. We will get another sample before discharge. Monitor her hemoglobin. Continue with coumadin for now.  3. Pain control: with tylenol and tramadol.  4.  DM: well controlled. On lantus 32 units and  premeal coverage.  Continue with SSI  5. Hypertension: suboptimal. Will adjust meds.  6. CAD; denies any chest pain or sob. Continue with the same treatment.  7. Hyponatremia: worsened over the last 24 hours. Stopped the fluids. tsh is within normal limits. Urine sodium is 7. Will get a serum osmolarity and urine osmolarity.   8. Seizures: no seizure activity. Continue with dilantin.  9. Hypothyroidism: tsh within normal limits and continue with synthroid  10. Depression: no suicidal ideation. Continue with celexa.  12. Fever:/ possible early pneumonia : day 3 of avelox.  13. Disposition: possible d/c in am to snf rehab.  11. DVT  PPX: on coumadin    LOS: 4 days   Courtney Pitts 02/27/2011, 6:46 PM

## 2011-02-27 NOTE — Progress Notes (Addendum)
ANTICOAGULATION CONSULT NOTE - Follow-Up Consult  Pharmacy Consult for Warfarin Indication: VTE prophylaxis  Allergies  Allergen Reactions  . Aspirin Other (See Comments)    dirrahea  . Sulfa Antibiotics Nausea And Vomiting   Patient Measurements: Height: 5\' 6"  (167.6 cm) Weight: 110 lb (49.896 kg) IBW/kg (Calculated) : 59.3   Vital Signs: Temp: 98 F (36.7 C) (02/11 0527) Temp src: Axillary (02/11 0527) BP: 178/69 mmHg (02/11 0527) Pulse Rate: 75  (02/11 0527)  Labs:  Basename 02/27/11 0410 02/26/11 0445 02/25/11 0422  HGB 8.5* 9.8* --  HCT 24.6* 27.5* 21.2*  PLT 134* 99* 107*  APTT -- -- --  LABPROT 22.2* 21.1* 20.8*  INR 1.91* 1.79* 1.76*  HEPARINUNFRC -- -- --  CREATININE 0.55 0.56 0.53  CKTOTAL -- -- --  CKMB -- -- --  TROPONINI -- -- --   Estimated Creatinine Clearance: 44.2 ml/min (by C-G formula based on Cr of 0.55).  Medical History: Past Medical History  Diagnosis Date  . Hypertension   . Diabetes mellitus   . Hypothyroid   . Stroke   . Seizures   . Arthritis   . Coronary artery disease   . Depression    Medications:  Prescriptions prior to admission  Medication Sig Dispense Refill  . acetaminophen (TYLENOL) 500 MG tablet Take 1,000 mg by mouth every 6 (six) hours as needed. For pain      . amLODipine (NORVASC) 2.5 MG tablet Take 2.5 mg by mouth daily.      . brimonidine (ALPHAGAN P) 0.1 % SOLN Place 1 drop into both eyes 2 (two) times daily.      . carvedilol (COREG) 12.5 MG tablet Take 12.5 mg by mouth 2 (two) times daily with a meal.      . citalopram (CELEXA) 20 MG tablet Take 20 mg by mouth daily.      Marland Kitchen esomeprazole (NEXIUM) 40 MG capsule Take 40 mg by mouth daily before breakfast.      . insulin glargine (LANTUS) 100 UNIT/ML injection Inject 7-30 Units into the skin at bedtime.      . isosorbide dinitrate (ISORDIL) 30 MG tablet Take 30 mg by mouth 4 (four) times daily.      Marland Kitchen levothyroxine (SYNTHROID, LEVOTHROID) 50 MCG tablet Take 50  mcg by mouth daily.      . metFORMIN (GLUCOPHAGE) 500 MG tablet Take 1,000 mg by mouth 2 (two) times daily with a meal.      . nitrofurantoin, macrocrystal-monohydrate, (MACROBID) 100 MG capsule Take 100 mg by mouth 2 (two) times daily.      . phenytoin (DILANTIN) 100 MG ER capsule Take 100 mg by mouth 3 (three) times daily.      . potassium chloride (K-DUR,KLOR-CON) 10 MEQ tablet Take 10 mEq by mouth daily.      Marland Kitchen torsemide (DEMADEX) 10 MG tablet Take 10 mg by mouth daily.       Scheduled:     . amLODipine  5 mg Oral Daily  . brimonidine  1 drop Both Eyes BID  . carvedilol  12.5 mg Oral BID WC  . citalopram  20 mg Oral Daily  . insulin aspart  0-15 Units Subcutaneous TID WC  . insulin aspart  0-5 Units Subcutaneous QHS  . insulin aspart  5 Units Subcutaneous TID WC  . insulin glargine  32 Units Subcutaneous QHS  . isosorbide dinitrate  40 mg Oral QID  . levothyroxine  50 mcg Oral QAC breakfast  . moxifloxacin  400 mg Intravenous q1800  . pantoprazole  80 mg Oral Q1200  . phenytoin  100 mg Oral TID  . warfarin  4 mg Oral ONCE-1800  . DISCONTD: amLODipine  2.5 mg Oral Daily  . DISCONTD: heparin  5,000 Units Subcutaneous Q8H  . DISCONTD: insulin aspart  3 Units Subcutaneous TID WC  . DISCONTD: insulin glargine  28 Units Subcutaneous QHS  . DISCONTD: isosorbide dinitrate  30 mg Oral QID   Infusions:     . DISCONTD: sodium chloride 1,000 mL (02/26/11 2035)   PRN: acetaminophen, acetaminophen, menthol-cetylpyridinium, meperidine, methocarbamol(ROBAXIN) IV, methocarbamol, metoCLOPramide (REGLAN) injection, metoCLOPramide, ondansetron (ZOFRAN) IV, ondansetron, phenol, promethazine, senna-docusate, traMADol  Inpatient warfarin doses: 4mg , 2.5mg , 2mg , 4 mg   Assessment:  78 yo F s/p nailing of femoral fx.   Warfarin maintenance dose requirements could be small due to age, gender, small size, concomitant phenytoin.  INR intially rose quickly, now leveling-off.  Also on  prophylactic-dose SQ heparin.  Avelox  can increase INR response.  Hgb decreasing again.   No bleeding reported/documented.  Goal of Therapy:  INR 2-3   Plan:   repeat 4mg  PO today.  INR daily.   F/U d/c SQ heparin when INR > 2   Dylan Ruotolo, Loma Messing PharmD 9:11 AM 02/27/2011

## 2011-02-28 ENCOUNTER — Encounter: Payer: Self-pay | Admitting: Internal Medicine

## 2011-02-28 LAB — BASIC METABOLIC PANEL
CO2: 25 mEq/L (ref 19–32)
Calcium: 8.4 mg/dL (ref 8.4–10.5)
Creatinine, Ser: 0.55 mg/dL (ref 0.50–1.10)
Glucose, Bld: 169 mg/dL — ABNORMAL HIGH (ref 70–99)

## 2011-02-28 LAB — CBC
HCT: 29.2 % — ABNORMAL LOW (ref 36.0–46.0)
Hemoglobin: 10.1 g/dL — ABNORMAL LOW (ref 12.0–15.0)
MCH: 29 pg (ref 26.0–34.0)
MCV: 83.9 fL (ref 78.0–100.0)
RBC: 3.48 MIL/uL — ABNORMAL LOW (ref 3.87–5.11)

## 2011-02-28 LAB — GLUCOSE, CAPILLARY: Glucose-Capillary: 176 mg/dL — ABNORMAL HIGH (ref 70–99)

## 2011-02-28 MED ORDER — WARFARIN SODIUM 2.5 MG PO TABS
2.5000 mg | ORAL_TABLET | Freq: Every day | ORAL | Status: DC
  Administered 2011-02-28: 2.5 mg via ORAL
  Filled 2011-02-28 (×2): qty 1

## 2011-02-28 MED ORDER — TRAMADOL HCL 50 MG PO TABS: 50.0000 mg | ORAL_TABLET | Freq: Four times a day (QID) | ORAL | Status: AC | PRN

## 2011-02-28 MED ORDER — WARFARIN SODIUM 2.5 MG PO TABS: 2.5000 mg | ORAL_TABLET | Freq: Every day | ORAL | Status: DC

## 2011-02-28 MED ORDER — AMLODIPINE BESYLATE 2.5 MG PO TABS: 10.0000 mg | ORAL_TABLET | Freq: Every day | ORAL | Status: DC

## 2011-02-28 MED ORDER — MOXIFLOXACIN HCL 400 MG PO TABS: 400.0000 mg | ORAL_TABLET | Freq: Every day | ORAL | Status: AC

## 2011-02-28 MED ORDER — METHOCARBAMOL 500 MG PO TABS: 500.0000 mg | ORAL_TABLET | Freq: Four times a day (QID) | ORAL | Status: AC | PRN

## 2011-02-28 MED ORDER — MOXIFLOXACIN HCL 400 MG PO TABS
400.0000 mg | ORAL_TABLET | Freq: Once | ORAL | Status: AC
  Administered 2011-02-28: 400 mg via ORAL
  Filled 2011-02-28: qty 1

## 2011-02-28 MED ORDER — INSULIN GLARGINE 100 UNIT/ML ~~LOC~~ SOLN: 32.0000 [IU] | Freq: Every day | SUBCUTANEOUS | Status: DC

## 2011-02-28 MED ORDER — INSULIN ASPART 100 UNIT/ML ~~LOC~~ SOLN: 0.0000 [IU] | Freq: Three times a day (TID) | SUBCUTANEOUS | Status: DC

## 2011-02-28 MED ORDER — INSULIN ASPART 100 UNIT/ML ~~LOC~~ SOLN: 0.0000 [IU] | Freq: Every day | SUBCUTANEOUS | Status: DC

## 2011-02-28 NOTE — Progress Notes (Signed)
Pt for d/c to [facility] today. IV/NSL d/c'd. Dressing CDI to R hip. No changes in am assessments. F/C d/c'd at 1450. Pt is DTV. IV ABT changed to oral/po. MD notified to contact son as requested. Left message for husband about d/c. Pt still with coughing episodes. Awaiting for ambulance to transport pt.

## 2011-02-28 NOTE — Progress Notes (Signed)
Physical Therapy Treatment Patient Details Name: Courtney Pitts MRN: [id number] DOB: [birth year] Today's Date: 02/28/2011  PT Assessment/Plan  PT - Assessment/Plan Comments on Treatment Session: Patient able to pivot to [facility], then to bed with walker and limited weight bearing on leg, but unable to walk due to weakness and unable to maintain 25% PWB for long enough to walk. PT Plan: Discharge plan remains appropriate PT Frequency: Min 3X/week Follow Up Recommendations: Skilled nursing facility Equipment Recommended: Defer to next venue PT Goals  Acute Rehab PT Goals Pt will go Sit to Supine/Side: with min assist PT Goal: Sit to Supine/Side - Progress: Progressing toward goal Pt will go Sit to Stand: with min assist PT Goal: Sit to Stand - Progress: Progressing toward goal Pt will go Stand to Sit: with min assist PT Goal: Stand to Sit - Progress: Progressing toward goal Pt will Transfer Bed to Chair/Chair to Bed: with mod assist PT Transfer Goal: Bed to Chair/Chair to Bed - Progress: Progressing toward goal  PT Treatment Precautions/Restrictions  Precautions Precautions: Fall Required Braces or Orthoses: No Restrictions Weight Bearing Restrictions: Yes RLE Weight Bearing: Partial weight bearing RLE Partial Weight Bearing Percentage or Pounds: 25% Mobility (including Balance) Bed Mobility Sit to Supine: 1: +2 Total assist;HOB flat;With rail;Patient percentage (comment) (Pt 30% with max VCs for hand placement and tech.) Scooting to [facility]: With trapeze;1: +2 Total assist Scooting to [facility] Details (indicate cue type and reason): pt=40% cues for technique Transfers Sit to Stand: 1: +2 Total assist;Patient percentage (comment);From chair/3-in-1;With upper extremity assist (Pt 30%) Sit to Stand Details (indicate cue type and reason): cues for technique Stand to Sit: To bed;To chair/3-in-1 Stand to Sit Details: assist to sit controlled especially to bed due to fatigue and patient sitting  before backed up to bed Stand Pivot Transfers: 1: +2 Total assist Stand Pivot Transfer Details (indicate cue type and reason): with walker and cues for technique pt=40%    Exercise    End of Session PT - End of Session Equipment Utilized During Treatment: Gait belt Activity Tolerance: Patient limited by fatigue Patient left: in bed;with call bell in reach General Behavior During Session: [facility] for tasks performed Cognition: Impaired, at baseline  [facility] 02/28/2011, 4:23 PM

## 2011-02-28 NOTE — Discharge Instructions (Signed)
Diabetes, Type 2, Am I At Risk?  Diabetes is a lasting (chronic) disease. In type 2 diabetes, the pancreas does not make enough insulin, and the body does not respond normally to the insulin that is made. This type of diabetes was also previously called adult onset diabetes. About 90% of all those who have diabetes have type 2. It usually occurs after the age of 40, but can occur at any age.    People develop type 2 diabetes because they do not use insulin properly. Eventually, the pancreas cannot make enough insulin for the body's needs. Over time, the amount of glucose (sugar) in the blood increases.  RISK FACTORS   Overweight - the more weight you have, the more resistant your cells become to insulin.    Family history - you are more likely to get diabetes if a parent or sibling has diabetes.    Race - certain races get diabetes more.    African Americans.    American Indians.    Asian Americans.    Hispanics.    Pacific Islander.    Inactive - exercise helps control weight and helps your cells be more sensitive to insulin.    Gestational diabetes - some women develop diabetes while they are pregnant. This goes away when they deliver. However, they are 50-60% more likely to develop type 2 diabetes at a later time.    Having a baby over 9 pounds - a sign that you may have had gestational diabetes.    Age - the risk of diabetes goes up as you get older, especially after age 45.    High blood pressure (hypertension).   SYMPTOMS  Many people have no signs or symptoms. Symptoms can be so mild that you might not even notice them. Some of these signs are:   Increased thirst.    Increased hunger.    Tiredness (fatigue).    Increased urination, especially at night.    Weight loss.    Blurred vision.    Sores that do not heal.   WHO SHOULD BE TESTED?   Anyone 45 years or older, especially if overweight, should consider getting tested.     If you are younger than 45, overweight, and have one or more of the risk factors, you should consider getting tested.   DIAGNOSIS   Fasting blood glucose (FBS). Usually, 2 are done.    FBS 101-125 mg/dl is considered pre-diabetes.    FBS 126 mg/dl or greater is considered diabetes.    2 hour Oral Glucose Tolerance Test (OGTT). This test is preformed by first having you not eat or drink for several hours. You are then given something sweet to drink and your blood glucose is measured fasting, at one hour and 2 hours. This test tells how well you are able to handle sugars or carbohydrates.    Fasting: 60-100 mg/dl.    1 hour: less than 200 mg/dl.    2 hours: less than 140 mg/dl.    A1c -A1c is a blood glucose test that gives and average of your blood glucose over 3 months. It is the accepted method to use to diagnose diabetes.    A1c 5.7-6.4% is considered pre-diabetes.    A1c 6.5% or greater is considered diabetes.   WHAT DOES IT MEAN TO HAVE PRE-DIABETES?  Pre-diabetes means you are at risk for getting type 2 diabetes. Your blood glucose is higher than normal, but not yet high enough to diagnose diabetes. The good news   is, if you have pre-diabetes you can reduce the risk of getting diabetes and even return to normal blood glucose levels. With modest weight loss and moderate physical activity, you can delay or prevent type 2 diabetes.    PREVENTION  You cannot do anything about race, age or family history, but you can lower your chances of getting diabetes. You can:     Exercise regularly and be active.    Reduce fat and calorie intake.    Make wise food choices as much as you can.    Reduce your intake of salt and alcohol.    Maintain a reasonable weight.    Keep blood pressure in an acceptable range. Take medication if needed.    Not smoke.    Maintain an acceptable cholesterol level (HDL, LDL, Triglycerides). Take medication if needed.   DOING MY PART: GETTING STARTED   Making big changes in your life is hard, especially if you are faced with more than one change. You can make it easier by taking these steps:   Make a plan to change behavior.    Decide exactly what you will do and when you will do it.    Plan what you need to get ready.    Think about what might prevent you from reaching your goals.    Find family and friends who will support and encourage you.    Decide how you will reward yourself when you do what you have planned.    Your doctor, dietitian, or counselor can help you make a plan.   HERE ARE SOME OF THE AREAS YOU MAY WISH TO CHANGE TO REDUCE YOUR RISK OF DIABETES.  If you are overweight or obese, choose sensible ways to get in shape. Even small amounts of weight loss, like 5-10 pounds, can help reduce the effects of insulin resistance and help blood glucose control.  Diet   Avoid crash diets. Instead, eat less of the foods you usually have. Limit the amount of fat you eat.    Increase your physical activity. Aim for at least 30 minutes of exercise most days of the week.    Set a reasonable weight-loss goal, such as losing 1 pound a week. Aim for a long-term goal of losing 5-7% of your total body weight.    Make wise food choices most of the time.    What you eat has a big impact on your health. By making wise food choices, you can help control your body weight, blood pressure, and cholesterol.    Take a hard look at the serving sizes of the foods you eat. Reduce serving sizes of meat, desserts, and foods high in fat. Increase your intake of fruits and vegetables.    Limit your fat intake to about 25% of your total calories. For example, if your food choices add up to about 2,000 calories a day, try to eat no more than 56 grams of fat. Your caregiver or a dietitian can help you figure out how much fat to have. You can check food labels for fat content too.    You may also want to reduce the number of calories you have each day.     Keep a food log. Write down what you eat, how much you eat, and anything else that helps keep you on track.    When you meet your goal, reward yourself with a nonfood item or activity.   Exercise   Be physically active every day.      you are not very active, you should start slowly at first. Talk with your caregiver first about what kinds of exercise would be safe for you. Make a plan to increase your activity level with the goal of being active for at least 30 minutes a day, most days of the week.   Choose activities you enjoy. Here are some ways to work extra activity into your daily routine:   Take the stairs rather than an elevator or escalator.   Park at the far end of the lot and walk.   Get off the bus a few stops early and walk the rest of the way.   Walk or bicycle instead of drive whenever you can.  Medications Some people need medication to help control their blood pressure or cholesterol levels. If you do, take your medicines as directed. Ask your caregiver whether there are any medicines you can take to prevent type 2 diabetes. Document Released: 01/05/2003 Document Revised: 09/14/2010 Document Reviewed: 09/30/2008 [facility] Patient Information 2012 [place], [place].

## 2011-02-28 NOTE — Progress Notes (Signed)
Occupational Therapy Treatment Patient Details Name: DEYSHA CARTIER MRN: [id number] DOB: 1929-08-25 Today's Date: 02/28/2011 [id number] 1TA OT Assessment/Plan OT Assessment/Plan Comments on Treatment Session: Pt still requiring extensive +2 assistance to mobilize with extreme difficulty maintaining PWB status during transfers. D/C plan [facility]. OT Plan: Discharge plan remains appropriate OT Frequency: Min 1X/week Follow Up Recommendations: Skilled nursing facility Equipment Recommended: Defer to next venue OT Goals ADL Goals ADL Goal: Toilet Transfer - Progress: Progressing toward goals ADL Goal: Toileting - Clothing Manipulation - Progress: Progressing toward goals ADL Goal: Toileting - Hygiene - Progress: Not progressing  OT Treatment Precautions/Restrictions      ADL ADL Toilet Transfer: Performed;+2 Total assistance;Comment for patient % (Pt 30%) Toilet Transfer Method: Stand pivot Acupuncturist: Bedside commode Toileting - Clothing Manipulation: Performed;+2 Total assistance (Pt 0%) ADL Comments: Pt with extreme difficulty maintaining 25% WB status when pivoting to commode and to bed. Pt lacks the UB strength to compensate through arms. Mobility  Bed Mobility Sit to Supine: 1: +2 Total assist;HOB flat;With rail;Patient percentage (comment) (Pt 30% with max VCs for hand placement and tech.) Transfers Sit to Stand: 1: +2 Total assist;Patient percentage (comment);From chair/3-in-1;With upper extremity assist (Pt 30%) Stand to Sit: 1: +2 Total assist;Patient percentage (comment);To chair/3-in-1;With upper extremity assist (Pt 30%) Exercises    End of Session OT - End of Session Equipment Utilized During Treatment: Gait belt;Other (comment) (RW, 3:1) Activity Tolerance: Patient limited by pain;Patient limited by fatigue Patient left: in bed;with call bell in reach General Behavior During Session: [facility] for tasks performed Cognition: Impaired, at  baseline  Wilma Wuthrich A OTR/L [phone] 02/28/2011, 4:05 PM

## 2011-02-28 NOTE — Discharge Summary (Addendum)
DISCHARGE SUMMARY  Courtney Pitts  MR#: [id number]  DOB:[birth year]  Date of Admission: 02/23/2011 Date of Discharge: 02/28/2011  Attending Physician:Yajaira Doffing  Patient's PCP:No primary provider on file.  Consults: orthopedic consult.   Discharge Diagnoses: Present on Admission:  .Fracture of right femur .Fall .Dehydration .DM (diabetes mellitus), type 2, uncontrolled .HTN (hypertension) .Leukocytosis Anemia     Medication List  As of 02/28/2011 12:04 PM   TAKE these medications         acetaminophen 500 MG tablet   Commonly known as: TYLENOL   Take 1,000 mg by mouth every 6 (six) hours as needed. For pain      amLODipine 2.5 MG tablet   Commonly known as: NORVASC   Take 4 tablets (10 mg total) by mouth daily.      brimonidine 0.1 % Soln   Commonly known as: ALPHAGAN P   Place 1 drop into both eyes 2 (two) times daily.      carvedilol 12.5 MG tablet   Commonly known as: COREG   Take 12.5 mg by mouth 2 (two) times daily with a meal.      citalopram 20 MG tablet   Commonly known as: CELEXA   Take 20 mg by mouth daily.      esomeprazole 40 MG capsule   Commonly known as: NEXIUM   Take 40 mg by mouth daily before breakfast.      insulin aspart 100 UNIT/ML injection   Commonly known as: novoLOG   Inject 0-5 Units into the skin at bedtime.      insulin aspart 100 UNIT/ML injection   Commonly known as: novoLOG   Inject 0-15 Units into the skin 3 (three) times daily with meals.      insulin glargine 100 UNIT/ML injection   Commonly known as: LANTUS   Inject 32 Units into the skin at bedtime.      isosorbide dinitrate 30 MG tablet   Commonly known as: ISORDIL   Take 30 mg by mouth 4 (four) times daily.      levothyroxine 50 MCG tablet   Commonly known as: SYNTHROID, LEVOTHROID   Take 50 mcg by mouth daily.      metFORMIN 500 MG tablet   Commonly known as: GLUCOPHAGE   Take 1,000 mg by mouth 2 (two) times daily with a meal.      methocarbamol 500 MG  tablet   Commonly known as: ROBAXIN   Take 1 tablet (500 mg total) by mouth every 6 (six) hours as needed.      avelox 500mg  1 tab daily for 5 days.             phenytoin 100 MG ER capsule   Commonly known as: DILANTIN   Take 100 mg by mouth 3 (three) times daily.      potassium chloride 10 MEQ tablet   Commonly known as: K-DUR,KLOR-CON   Take 10 mEq by mouth daily.      torsemide 10 MG tablet   Commonly known as: DEMADEX   Take 10 mg by mouth daily.      traMADol 50 MG tablet   Commonly known as: ULTRAM   Take 1 tablet (50 mg total) by mouth every 6 (six) hours as needed.      warfarin 2.5 MG tablet   Commonly known as: COUMADIN   Take 1 tablet (2.5 mg total) by mouth daily at 6 PM.          Brief Admit Note:  78 years old woman with multiple comorbidity, was brought into the ER after she slipped and fell on her kitchen floor this morning, patient stated that she was wearing socks , she denies any preceding dizziness, lightheadedness, chest pain or shortness of breath. She was found sitting on the floor by husband and started to complain of right hip pain. In the ED x-ray to showed right hip fracture, orthopedic service was consulted and they requested hospitalist admit .    Hospital Course: Present on Admission:  1. Fracture of right femur s/p Intra medullary Nail Femoral for right intertrochanteric fracture. Continue with coumadin for 4 weeks. And check INR once every week to keep it between 2 to 3.   2. Anemia of blood loss:  - s/p 3 U PRBC.  Asper the son, he saw some bright blood per rectum prior to admission. Her hemoglobin dropped from the hgb of 11 to 7.1 after surgery. We are assuming the drop in hemoglobin probably secondary to blood loss from surgery. But this new h/o BRBpr, was worked up with a stool for occult blood analysis. The stool for occult blood is negative. Her hemoglobin actually improved to 10 on the day of discharge. If she has any further bleeding  episodes in the future, recommend to the stop the coumadin and she probably needs to be evaluated by a gastroenterologist as outpatient.   3. Pain control: with tylenol and tramadol.  4. DM: well controlled. On lantus 32 units and premeal coverage.  Continue with SSI  5. Hypertension: better controlled.  .  6. CAD; denies any chest pain or sob. Continue with the same treatment.  7. Hyponatremia:  Improved. Probably sec to SIADH. Stopped the fluids. tsh is within normal limits. Urine sodium is 7. Serum osmolarity is 273.  9. Hypothyroidism: tsh within normal limits and continue with synthroid  10. Depression: no suicidal ideation. Continue with celexa.  12. Fever:/ ON POST OP day 3 she developed fever, and a cxr was obtained, which showed possible early pneumonia vs atelectasis.  She is being discharged on 3 days of avelox.  Day of Discharge BP 143/73  Pulse 82  Temp(Src) 97.3 F (36.3 C) (Oral)  Resp 16  Ht 5\' 6"  (1.676 m)  Wt 49.896 kg (110 lb)  BMI 17.75 kg/m2  SpO2 97%  Physical Exam: On exam  She is alert afebrile and comfortable  CVS S1S2  Lungs : decreased at bases  Abdomen: soft NT, ND BS+  Extremities: No pedal edema cyanosis or clubbing   Results for orders placed during the hospital encounter of 02/23/11 (from the past 24 hour(s))  BASIC METABOLIC PANEL     Status: Abnormal   Collection Time   02/27/11 12:06 PM      Component Value Range   Sodium 126 (*) 135 - 145 (mEq/L)   Potassium 4.8  3.5 - 5.1 (mEq/L)   Chloride 97  96 - 112 (mEq/L)   CO2 23  19 - 32 (mEq/L)   Glucose, Bld 177 (*) 70 - 99 (mg/dL)   BUN 17  6 - 23 (mg/dL)   Creatinine, Ser 1.61  0.50 - 1.10 (mg/dL)   Calcium 7.5 (*) 8.4 - 10.5 (mg/dL)   GFR calc non Af Amer 83 (*) >90 (mL/min)   GFR calc Af Amer >90  >90 (mL/min)  GLUCOSE, CAPILLARY     Status: Abnormal   Collection Time   02/27/11  4:44 PM      Component Value Range  Glucose-Capillary 171 (*) 70 - 99 (mg/dL)   Comment 1 Documented in  Chart     Comment 2 Notify RN    GLUCOSE, CAPILLARY     Status: Abnormal   Collection Time   02/27/11  9:28 PM      Component Value Range   Glucose-Capillary 122 (*) 70 - 99 (mg/dL)  OSMOLALITY, URINE     Status: Abnormal   Collection Time   02/27/11  9:51 PM      Component Value Range   Osmolality, Ur 154 (*) 390 - 1090 (mOsm/kg)  PROTIME-INR     Status: Abnormal   Collection Time   02/28/11  4:18 AM      Component Value Range   Prothrombin Time 26.1 (*) 11.6 - 15.2 (seconds)   INR 2.35 (*) 0.00 - 1.49   CBC     Status: Abnormal   Collection Time   02/28/11  4:18 AM      Component Value Range   WBC 6.5  4.0 - 10.5 (K/uL)   RBC 3.48 (*) 3.87 - 5.11 (MIL/uL)   Hemoglobin 10.1 (*) 12.0 - 15.0 (g/dL)   HCT 16.1 (*) 09.6 - 46.0 (%)   MCV 83.9  78.0 - 100.0 (fL)   MCH 29.0  26.0 - 34.0 (pg)   MCHC 34.6  30.0 - 36.0 (g/dL)   RDW 04.5 (*) 40.9 - 15.5 (%)   Platelets 184  150 - 400 (K/uL)  BASIC METABOLIC PANEL     Status: Abnormal   Collection Time   02/28/11  4:18 AM      Component Value Range   Sodium 130 (*) 135 - 145 (mEq/L)   Potassium 4.6  3.5 - 5.1 (mEq/L)   Chloride 98  96 - 112 (mEq/L)   CO2 25  19 - 32 (mEq/L)   Glucose, Bld 169 (*) 70 - 99 (mg/dL)   BUN 17  6 - 23 (mg/dL)   Creatinine, Ser 8.11  0.50 - 1.10 (mg/dL)   Calcium 8.4  8.4 - 91.4 (mg/dL)   GFR calc non Af Amer 86 (*) >90 (mL/min)   GFR calc Af Amer >90  >90 (mL/min)  OSMOLALITY     Status: Abnormal   Collection Time   02/28/11  4:18 AM      Component Value Range   Osmolality 273 (*) 275 - 300 (mOsm/kg)  GLUCOSE, CAPILLARY     Status: Abnormal   Collection Time   02/28/11  7:04 AM      Component Value Range   Glucose-Capillary 176 (*) 70 - 99 (mg/dL)    Disposition: SNF rehab.   Follow-up Appts: Discharge Orders    Future Orders Please Complete By Expires   Diet - low sodium heart healthy      Discharge instructions      Comments:   Follow up with Dr August Saucer at [facility] as scheduled.  Continue coumadin for 4 weeks and then stop it. Check INR once every week and keet it therapeutic between 2to 3.   Activity as tolerated - No restrictions           Tests Needing Follow-up: INR  Time spent in discharge (includes decision making & examination of pt): 45 minutes  Signed: Kenta Laster 02/28/2011, 12:04 PM

## 2011-02-28 NOTE — Progress Notes (Signed)
UR completed 

## 2011-02-28 NOTE — Progress Notes (Signed)
ANTICOAGULATION CONSULT NOTE - Follow-Up Consult  Pharmacy Consult for Warfarin Indication: VTE prophylaxis  Allergies  Allergen Reactions  . Aspirin Other (See Comments)    dirrahea  . Sulfa Antibiotics Nausea And Vomiting   Patient Measurements: Height: 5\' 6"  (167.6 cm) Weight: 110 lb (49.896 kg) IBW/kg (Calculated) : 59.3   Vital Signs: Temp: 99.1 F (37.3 C) (02/12 0621) BP: 194/82 mmHg (02/12 0657) Pulse Rate: 80  (02/12 0657)  Labs:  Basename 02/28/11 0418 02/27/11 1206 02/27/11 0410 02/26/11 0445  HGB 10.1* -- 8.5* --  HCT 29.2* -- 24.6* 27.5*  PLT 184 -- 134* 99*  APTT -- -- -- --  LABPROT 26.1* -- 22.2* 21.1*  INR 2.35* -- 1.91* 1.79*  HEPARINUNFRC -- -- -- --  CREATININE 0.55 0.62 0.55 --  CKTOTAL -- -- -- --  CKMB -- -- -- --  TROPONINI -- -- -- --   Estimated Creatinine Clearance: 44.2 ml/min (by C-G formula based on Cr of 0.55).  Medical History: Past Medical History  Diagnosis Date  . Hypertension   . Diabetes mellitus   . Hypothyroid   . Stroke   . Seizures   . Arthritis   . Coronary artery disease   . Depression    Medications:  Prescriptions prior to admission  Medication Sig Dispense Refill  . acetaminophen (TYLENOL) 500 MG tablet Take 1,000 mg by mouth every 6 (six) hours as needed. For pain      . amLODipine (NORVASC) 2.5 MG tablet Take 2.5 mg by mouth daily.      . brimonidine (ALPHAGAN P) 0.1 % SOLN Place 1 drop into both eyes 2 (two) times daily.      . carvedilol (COREG) 12.5 MG tablet Take 12.5 mg by mouth 2 (two) times daily with a meal.      . citalopram (CELEXA) 20 MG tablet Take 20 mg by mouth daily.      Marland Kitchen esomeprazole (NEXIUM) 40 MG capsule Take 40 mg by mouth daily before breakfast.      . insulin glargine (LANTUS) 100 UNIT/ML injection Inject 7-30 Units into the skin at bedtime.      . isosorbide dinitrate (ISORDIL) 30 MG tablet Take 30 mg by mouth 4 (four) times daily.      Marland Kitchen levothyroxine (SYNTHROID, LEVOTHROID) 50 MCG  tablet Take 50 mcg by mouth daily.      . metFORMIN (GLUCOPHAGE) 500 MG tablet Take 1,000 mg by mouth 2 (two) times daily with a meal.      . nitrofurantoin, macrocrystal-monohydrate, (MACROBID) 100 MG capsule Take 100 mg by mouth 2 (two) times daily.      . phenytoin (DILANTIN) 100 MG ER capsule Take 100 mg by mouth 3 (three) times daily.      . potassium chloride (K-DUR,KLOR-CON) 10 MEQ tablet Take 10 mEq by mouth daily.      Marland Kitchen torsemide (DEMADEX) 10 MG tablet Take 10 mg by mouth daily.       Scheduled:     . amLODipine  5 mg Oral Daily  . brimonidine  1 drop Both Eyes BID  . carvedilol  12.5 mg Oral BID WC  . citalopram  20 mg Oral Daily  . insulin aspart  0-15 Units Subcutaneous TID WC  . insulin aspart  0-5 Units Subcutaneous QHS  . insulin aspart  5 Units Subcutaneous TID WC  . insulin glargine  32 Units Subcutaneous QHS  . isosorbide dinitrate  40 mg Oral QID  . levothyroxine  50  mcg Oral QAC breakfast  . loratadine  10 mg Oral Daily  . moxifloxacin  400 mg Intravenous q1800  . pantoprazole  80 mg Oral Q1200  . phenytoin  100 mg Oral TID  . warfarin  4 mg Oral ONCE-1800   Infusions:    Inpatient warfarin doses this admission: 4, 2.5, 2, 4 mg   Assessment:  79 yo F s/p repair of femur fx.   Warfarin maintenance dose requirements could be small due to age, gender, small size, concomitant phenytoin and Avelox.  INR therapeutic.  SQ heparin DCd  No overt bleeding reported  Goal of Therapy:  INR 2-3   Plan:   Placed on warfarin 2.5mg  PO daily for now.  Continue INR daily while inpatient.   If patient remains on warfarin after discharge, she will need close INR follow-up.   Recommend INR  3 times per week during first seven days post-discharge, then at least 2 times per week until stable and therapeutic, then at least once a week while on warfarin.  Elie Goody, PharmD, BCPS Pager: [phone] 02/28/2011  10:50 AM

## 2011-03-01 NOTE — Progress Notes (Signed)
Patient set to discharge to [facility] today. Son & patient made aware. [facility] called for transport.   Unice Bailey, LCSWA [phone]

## 2011-03-09 LAB — PROTIME-INR: INR: 1

## 2011-03-17 ENCOUNTER — Encounter: Payer: Self-pay | Admitting: Internal Medicine

## 2011-03-20 ENCOUNTER — Encounter (HOSPITAL_COMMUNITY): Payer: Self-pay | Admitting: Orthopedic Surgery

## 2011-04-06 LAB — BASIC METABOLIC PANEL
Chloride: 89 mmol/L — ABNORMAL LOW (ref 98–107)
Co2: 27 mmol/L (ref 21–32)
Creatinine: 0.63 mg/dL (ref 0.60–1.30)
Potassium: 4.2 mmol/L (ref 3.5–5.1)
Sodium: 124 mmol/L — ABNORMAL LOW (ref 136–145)

## 2011-04-11 LAB — BASIC METABOLIC PANEL
BUN: 17 mg/dL (ref 7–18)
Calcium, Total: 8.8 mg/dL (ref 8.5–10.1)
Chloride: 91 mmol/L — ABNORMAL LOW (ref 98–107)
Creatinine: 0.48 mg/dL — ABNORMAL LOW (ref 0.60–1.30)
EGFR (African American): >60
Sodium: 127 mmol/L — ABNORMAL LOW (ref 136–145)

## 2011-04-14 LAB — URINALYSIS, COMPLETE
Ketone: NEGATIVE
Nitrite: NEGATIVE
RBC,UR: 1 /HPF (ref 0–5)
Squamous Epithelial: <1
WBC UR: <1 /HPF (ref 0–5)

## 2011-04-15 LAB — URINE CULTURE

## 2011-04-17 ENCOUNTER — Encounter: Payer: Self-pay | Admitting: Internal Medicine

## 2011-04-20 LAB — BASIC METABOLIC PANEL
Anion Gap: 8 (ref 7–16)
Chloride: 93 mmol/L — ABNORMAL LOW (ref 98–107)
Co2: 29 mmol/L (ref 21–32)
EGFR (Non-African Amer.): >60
Glucose: 100 mg/dL — ABNORMAL HIGH (ref 65–99)
Sodium: 130 mmol/L — ABNORMAL LOW (ref 136–145)

## 2011-05-17 ENCOUNTER — Encounter: Payer: Self-pay | Admitting: Internal Medicine

## 2011-07-27 ENCOUNTER — Emergency Department (HOSPITAL_COMMUNITY)
Admission: EM | Admit: 2011-07-27 | Discharge: 2011-07-27 | Disposition: A | Discharge status: Home / Self Care | Payer: Medicare Other | Attending: Emergency Medicine | Admitting: Emergency Medicine

## 2011-07-27 ENCOUNTER — Encounter (HOSPITAL_COMMUNITY): Payer: Self-pay

## 2011-07-27 ENCOUNTER — Emergency Department (HOSPITAL_COMMUNITY): Payer: Medicare Other

## 2011-07-27 DIAGNOSIS — Z8673 Personal history of transient ischemic attack (TIA), and cerebral infarction without residual deficits: Secondary | ICD-10-CM | POA: Insufficient documentation

## 2011-07-27 DIAGNOSIS — Z794 Long term (current) use of insulin: Secondary | ICD-10-CM | POA: Insufficient documentation

## 2011-07-27 DIAGNOSIS — N390 Urinary tract infection, site not specified: Secondary | ICD-10-CM | POA: Insufficient documentation

## 2011-07-27 DIAGNOSIS — R4182 Altered mental status, unspecified: Secondary | ICD-10-CM

## 2011-07-27 DIAGNOSIS — R404 Transient alteration of awareness: Secondary | ICD-10-CM | POA: Insufficient documentation

## 2011-07-27 DIAGNOSIS — Z9181 History of falling: Secondary | ICD-10-CM | POA: Insufficient documentation

## 2011-07-27 DIAGNOSIS — E119 Type 2 diabetes mellitus without complications: Secondary | ICD-10-CM | POA: Insufficient documentation

## 2011-07-27 DIAGNOSIS — R630 Anorexia: Secondary | ICD-10-CM | POA: Insufficient documentation

## 2011-07-27 DIAGNOSIS — R350 Frequency of micturition: Secondary | ICD-10-CM | POA: Insufficient documentation

## 2011-07-27 DIAGNOSIS — I2510 Atherosclerotic heart disease of native coronary artery without angina pectoris: Secondary | ICD-10-CM | POA: Insufficient documentation

## 2011-07-27 DIAGNOSIS — E875 Hyperkalemia: Secondary | ICD-10-CM | POA: Insufficient documentation

## 2011-07-27 DIAGNOSIS — F329 Major depressive disorder, single episode, unspecified: Secondary | ICD-10-CM | POA: Insufficient documentation

## 2011-07-27 DIAGNOSIS — E871 Hypo-osmolality and hyponatremia: Secondary | ICD-10-CM | POA: Insufficient documentation

## 2011-07-27 DIAGNOSIS — F3289 Other specified depressive episodes: Secondary | ICD-10-CM | POA: Insufficient documentation

## 2011-07-27 DIAGNOSIS — I10 Essential (primary) hypertension: Secondary | ICD-10-CM | POA: Insufficient documentation

## 2011-07-27 DIAGNOSIS — G40909 Epilepsy, unspecified, not intractable, without status epilepticus: Secondary | ICD-10-CM | POA: Insufficient documentation

## 2011-07-27 DIAGNOSIS — M129 Arthropathy, unspecified: Secondary | ICD-10-CM | POA: Insufficient documentation

## 2011-07-27 DIAGNOSIS — Z79899 Other long term (current) drug therapy: Secondary | ICD-10-CM | POA: Insufficient documentation

## 2011-07-27 DIAGNOSIS — E039 Hypothyroidism, unspecified: Secondary | ICD-10-CM | POA: Insufficient documentation

## 2011-07-27 LAB — CBC WITH DIFFERENTIAL/PLATELET
Basophils Relative: 0 % (ref 0–1)
Eosinophils Relative: 1 % (ref 0–5)
HCT: 32.1 % — ABNORMAL LOW (ref 36.0–46.0)
Hemoglobin: 11.7 g/dL — ABNORMAL LOW (ref 12.0–15.0)
Lymphocytes Relative: 12 % (ref 12–46)
Monocytes Relative: 7 % (ref 3–12)
Neutro Abs: 5.8 10*3/uL (ref 1.7–7.7)
Neutrophils Relative %: 80 % — ABNORMAL HIGH (ref 43–77)
RBC: 3.59 MIL/uL — ABNORMAL LOW (ref 3.87–5.11)
WBC: 7.3 10*3/uL (ref 4.0–10.5)

## 2011-07-27 LAB — COMPREHENSIVE METABOLIC PANEL
Albumin: 3.8 g/dL (ref 3.5–5.2)
Alkaline Phosphatase: 169 U/L — ABNORMAL HIGH (ref 39–117)
BUN: 36 mg/dL — ABNORMAL HIGH (ref 6–23)
CO2: 26 mEq/L (ref 19–32)
Chloride: 83 mEq/L — ABNORMAL LOW (ref 96–112)
Creatinine, Ser: 0.67 mg/dL (ref 0.50–1.10)
GFR calc non Af Amer: 81 mL/min — ABNORMAL LOW (ref >90)
Potassium: 5.3 mEq/L — ABNORMAL HIGH (ref 3.5–5.1)
Total Bilirubin: 0.3 mg/dL (ref 0.3–1.2)

## 2011-07-27 LAB — URINE MICROSCOPIC-ADD ON

## 2011-07-27 LAB — GLUCOSE, CAPILLARY: Glucose-Capillary: 103 mg/dL — ABNORMAL HIGH (ref 70–99)

## 2011-07-27 LAB — URINALYSIS, ROUTINE W REFLEX MICROSCOPIC
Glucose, UA: NEGATIVE mg/dL
Ketones, ur: NEGATIVE mg/dL
Protein, ur: NEGATIVE mg/dL
Urobilinogen, UA: 0.2 mg/dL (ref 0.0–1.0)

## 2011-07-27 LAB — AMMONIA: Ammonia: 17 umol/L (ref 11–60)

## 2011-07-27 MED ORDER — CEPHALEXIN 500 MG PO CAPS: 500.0000 mg | ORAL_CAPSULE | Freq: Four times a day (QID) | ORAL | Status: AC

## 2011-07-27 MED ORDER — DEXTROSE 5 % IV SOLN
1.0000 g | Freq: Once | INTRAVENOUS | Status: AC
  Administered 2011-07-27: 1 g via INTRAVENOUS
  Filled 2011-07-27: qty 10

## 2011-07-27 MED ORDER — SODIUM CHLORIDE 0.9 % IV SOLN
INTRAVENOUS | Status: DC
  Administered 2011-07-27: 16:00:00 via INTRAVENOUS

## 2011-07-27 NOTE — ED Notes (Signed)
Pt dcd with son.  Pt/son verbalizes understanding./  Pt aaox3.

## 2011-07-27 NOTE — Progress Notes (Signed)
Following choices offered   HOME HEALTH AGENCIES SERVING [place]   Agencies that are Medicare-Certified and are affiliated with [facility]  Telephone Number Address  [facility]   The [facility] has ownership interest in this company; however, you are under no obligation to use this agency. [phone] or  [phone] [address] [place], [place] [address]   Agencies that are Medicare-Certified and are not affiliated with [facility] Telephone Number Address  [facility] [phone] Fax [phone] [address], [address] [place], [place]  [address]  [facility] [phone] or [phone] Fax [phone] [address] [place], [place] [address]  [facility] [phone] Fax [phone] [address] [place], [place] [address]  [facility] [phone] Fax [phone] [address] [place], [place]  [address]  [facility] [phone] Fax [phone] [address] [place], [place] [address]  [facility] [phone] [address] [place], [place] [address]  [facility] [phone]  [address] [place], [place] [address]  [facility] [phone] or [phone] Fax [phone] [address] [place], [place]  [address]  [facility] [phone] Fax [phone] [address] [place], [place]  [address]  [facility]  [phone] Fax [phone] [address] [place], [place] [address]

## 2011-07-27 NOTE — ED Notes (Signed)
Contact   Bethann Berkshire Zettlemoyer Daughter in law [phone]

## 2011-07-27 NOTE — Progress Notes (Signed)
pcp is dr Sullivan Lone who is out of town per son but his RN will assist [facility] Reviewed HH orders entered by Dr Radford Pax Goal is to get pt placed in dementia unit in snf in future.  Provided son with list of home health agencies and snf within 50 mile radius of hospital to assist with making choice.  Reviewed process of bed search and [facility] Son was already familiar with [facility] because pcp told him he would assist with this.  Son wanting to change pcp after pt placed in snf.  Aware that [facility] has 24 hr standard to contact pt's home Son is ok with this and states pt will be safe with someone watching her

## 2011-07-27 NOTE — ED Notes (Signed)
Patient's daughter-in-law reports that the patient has had altered mental status x 2 weeks and has gotten worse win the past 2 days. Patient has had decreased appetite, frequent urination, increased sleepiness, frequent falls at home.

## 2011-07-27 NOTE — Progress Notes (Signed)
[facility] ED CM signing off Completed referral to [facility] per family choice via [facility]

## 2011-07-27 NOTE — ED Provider Notes (Signed)
History     CSN: [id number]  Arrival date & time 07/27/11  1309   First MD Initiated Contact with Patient 07/27/11 1459      Chief Complaint  Patient presents with  . Altered Mental Status    HPI Patient's daughter-in-law reports that the patient has had altered mental status x 2 weeks and has gotten worse win the past 2 days. Patient has had decreased appetite, frequent urination, increased sleepiness, frequent falls at home.  No known fever or recent head injury.  Patient recently has had to start wearing depends which is unusual.  Past Medical History  Diagnosis Date  . Hypertension   . Diabetes mellitus   . Hypothyroid   . Stroke   . Seizures   . Arthritis   . Coronary artery disease   . Depression     Past Surgical History  Procedure Date  . Abdominal hysterectomy   . Appendectomy   . Coronary artery bypass graft 2006    Triple Bipass @ [facility]  . Femur im nail 02/23/2011    Procedure: INTRAMEDULLARY (IM) NAIL FEMORAL;  Surgeon: Cammy Copa, MD;  Location: [facility];  Service: Orthopedics;  Laterality: Right;  . Cardiac surgery     No family history on file.  History  Substance Use Topics  . Smoking status: Never Smoker   . Smokeless tobacco: Former Neurosurgeon    Quit date: 11/23/2010  . Alcohol Use: No    OB History    Grav Para Term Preterm Abortions TAB SAB Ect Mult Living                  Review of Systems  Unable to perform ROS   Allergies  Aspirin and Sulfa antibiotics  Home Medications   Current Outpatient Rx  Name Route Sig Dispense Refill  . AMLODIPINE BESYLATE 5 MG PO TABS Oral Take 5 mg by mouth 2 (two) times daily.    Marland Kitchen BRIMONIDINE TARTRATE 0.1 % OP SOLN Both Eyes Place 1 drop into both eyes 2 (two) times daily.    Marland Kitchen CARVEDILOL 12.5 MG PO TABS Oral Take 12.5 mg by mouth 2 (two) times daily with a meal.    . VITAMIN D 1000 UNITS PO TABS Oral Take 2,000 Units by mouth daily.    Marland Kitchen CITALOPRAM HYDROBROMIDE 20 MG PO TABS Oral Take 20 mg  by mouth daily.    Marland Kitchen ESOMEPRAZOLE MAGNESIUM 40 MG PO CPDR Oral Take 40 mg by mouth daily.     Marland Kitchen HYDROCHLOROTHIAZIDE 25 MG PO TABS Oral Take 25 mg by mouth daily.    Marland Kitchen HYDROCODONE-ACETAMINOPHEN 10-325 MG PO TABS Oral Take 0.5 tablets by mouth 2 (two) times daily.    . INSULIN GLARGINE 100 UNIT/ML [place] SOLN Subcutaneous Inject 10 Units into the skin at bedtime.     Marland Kitchen LEVOTHYROXINE SODIUM 50 MCG PO TABS Oral Take 50 mcg by mouth daily.    Marland Kitchen METFORMIN HCL 1000 MG PO TABS Oral Take 1,000 mg by mouth 2 (two) times daily.    Marland Kitchen PHENYTOIN SODIUM EXTENDED 100 MG PO CAPS Oral Take 100 mg by mouth 3 (three) times daily.    Marland Kitchen POTASSIUM CHLORIDE CRYS ER 10 MEQ PO TBCR Oral Take 10 mEq by mouth daily.    . TORSEMIDE 5 MG PO TABS Oral Take 5 mg by mouth daily.      BP 168/40  Pulse 61  Temp 97.5 F (36.4 C) (Oral)  Resp 14  SpO2 99%  Physical Exam  Nursing note and vitals reviewed. Constitutional: She appears well-developed and well-nourished. She appears listless. No distress.  HENT:  Head: Normocephalic and atraumatic.  Eyes: Pupils are equal, round, and reactive to light.  Neck: Normal range of motion.  Cardiovascular: Normal rate and intact distal pulses.   Pulmonary/Chest: No respiratory distress. She has no wheezes.  Abdominal: Normal appearance. She exhibits no distension. There is no tenderness. There is no rebound and no guarding.  Musculoskeletal: Normal range of motion.  Neurological: She appears listless. No cranial nerve deficit. GCS eye subscore is 4. GCS verbal subscore is 5. GCS motor subscore is 6.  Skin: Skin is warm and dry. No rash noted.  Psychiatric: She has a normal mood and affect. Her behavior is normal.     3-cB:Coreg,Norvasc  2-Pt:Demadex,Coreg  2-HCTZ:Celexa-other  1-HCTZ:Celexa-other  2-Pt:HCTZ,Coreg  2-Pt:Demadex,HCTZ  2-Dilantin: Norvasc    ED Course  Procedures (including critical care time)   Scheduled Meds:   . cefTRIAXone (ROCEPHIN)  IV  1 g  Intravenous Once   Continuous Infusions:   . sodium chloride 125 mL/hr at 07/27/11 1613   PRN Meds:.  Labs Reviewed  GLUCOSE, CAPILLARY - Abnormal; Notable for the following:    Glucose-Capillary 103 (*)     All other components within normal limits  CBC WITH DIFFERENTIAL - Abnormal; Notable for the following:    RBC 3.59 (*)     Hemoglobin 11.7 (*)     HCT 32.1 (*)     MCHC 36.4 (*)     Platelets 100 (*)     Neutrophils Relative 80 (*)     All other components within normal limits  COMPREHENSIVE METABOLIC PANEL - Abnormal; Notable for the following:    Sodium 121 (*)     Potassium 5.3 (*)     Chloride 83 (*)     BUN 36 (*)     Alkaline Phosphatase 169 (*)     GFR calc non Af Amer 81 (*)     All other components within normal limits  URINALYSIS, ROUTINE W REFLEX MICROSCOPIC - Abnormal; Notable for the following:    APPearance CLOUDY (*)     Hgb urine dipstick SMALL (*)     Leukocytes, UA LARGE (*)     All other components within normal limits  URINE MICROSCOPIC-ADD ON - Abnormal; Notable for the following:    Bacteria, UA MANY (*)     All other components within normal limits  PHENYTOIN LEVEL, TOTAL  AMMONIA  URINE CULTURE   Dg Chest 2 View  07/27/2011  *RADIOLOGY REPORT*  Clinical Data: 78 year old female with altered mental status and cough.  CHEST - 2 VIEW  Comparison: 02/25/2011 and earlier.  Findings: Larger lung volumes.  Interval improved bibasilar ventilation.  Sequelae of CABG.  Cardiac size and mediastinal contours are within normal limits.  Visualized tracheal air column is within normal limits.  No pneumothorax, pulmonary edema, pleural effusion or confluent pulmonary opacity.  Diffuse osteopenia. Calcified atherosclerosis of the aorta.  IMPRESSION: No acute cardiopulmonary abnormality.  Original Report Authenticated By: Ulla Potash III, M.D.     1. Urinary tract infection   2. Altered mental status   3. Hyponatremia   4. Hyperkalemia       MDM           Nelia Shi, MD 07/27/11 [phone]

## 2011-07-27 NOTE — Progress Notes (Signed)
[facility] ED CM contacted to assist with home health by EDP [place].  Spoke with [facility] Reviewed home health services offered

## 2011-07-27 NOTE — ED Notes (Signed)
Unable to obtain blood from patient at this time. RN aware.

## 2011-07-27 NOTE — ED Notes (Signed)
Patient aware of need for urine testing. Patient unable to void at this time. Patient encouraged to call for toileting assistance  

## 2011-07-28 NOTE — Progress Notes (Signed)
[facility] ED CM received an email with response from [facility] via [facility] Fri 07/28/2011 7:53 AM AVAIL: AIBHLINN KALMAR MRN [id number]; FROM [facility]; ; [phone];  [[phone]]

## 2011-08-01 LAB — URINE CULTURE: Colony Count: >=100000

## 2011-08-02 NOTE — ED Notes (Signed)
+   Urine Patient treated with Keflex-sensitive to same-chart appended per protocol MD. 

## 2011-10-28 ENCOUNTER — Emergency Department: Payer: Self-pay | Admitting: Internal Medicine

## 2011-10-28 LAB — BASIC METABOLIC PANEL
Anion Gap: 11 (ref 7–16)
BUN: 36 mg/dL — ABNORMAL HIGH (ref 7–18)
Calcium, Total: 8.9 mg/dL (ref 8.5–10.1)
Chloride: 90 mmol/L — ABNORMAL LOW (ref 98–107)
Co2: 27 mmol/L (ref 21–32)
Osmolality: 277 (ref 275–301)
Potassium: 4.7 mmol/L (ref 3.5–5.1)

## 2011-10-28 LAB — URINALYSIS, COMPLETE
Bilirubin,UR: NEGATIVE
Blood: NEGATIVE
Hyaline Cast: 4
Nitrite: NEGATIVE
Ph: 6 (ref 4.5–8.0)
Squamous Epithelial: <1

## 2011-10-28 LAB — CBC
MCH: 34 pg (ref 26.0–34.0)
MCV: 98 fL (ref 80–100)
Platelet: 119 10*3/uL — ABNORMAL LOW (ref 150–440)
RBC: 3.45 10*6/uL — ABNORMAL LOW (ref 3.80–5.20)
RDW: 16.2 % — ABNORMAL HIGH (ref 11.5–14.5)

## 2012-04-25 ENCOUNTER — Ambulatory Visit: Payer: Self-pay | Admitting: Family Medicine

## 2012-08-16 ENCOUNTER — Ambulatory Visit: Payer: Self-pay | Admitting: Nurse Practitioner

## 2012-09-10 ENCOUNTER — Inpatient Hospital Stay: Payer: Self-pay | Admitting: Internal Medicine

## 2012-09-10 LAB — URINALYSIS, COMPLETE
Bilirubin,UR: NEGATIVE
Nitrite: NEGATIVE
Ph: 7 (ref 4.5–8.0)
RBC,UR: 5 /HPF (ref 0–5)
Specific Gravity: 1.008 (ref 1.003–1.030)
Squamous Epithelial: NONE SEEN
WBC UR: 298 /HPF (ref 0–5)

## 2012-09-10 LAB — CBC
HCT: 34 % — ABNORMAL LOW (ref 35.0–47.0)
HGB: 12.4 g/dL (ref 12.0–16.0)
MCH: 32.8 pg (ref 26.0–34.0)
Platelet: 141 10*3/uL — ABNORMAL LOW (ref 150–440)
RBC: 3.77 10*6/uL — ABNORMAL LOW (ref 3.80–5.20)
RDW: 14.5 % (ref 11.5–14.5)
WBC: 7.1 10*3/uL (ref 3.6–11.0)

## 2012-09-10 LAB — COMPREHENSIVE METABOLIC PANEL
Anion Gap: 7 (ref 7–16)
Glucose: 152 mg/dL — ABNORMAL HIGH (ref 65–99)
Osmolality: 242 (ref 275–301)
SGPT (ALT): 23 U/L (ref 12–78)
Total Protein: 7.6 g/dL (ref 6.4–8.2)

## 2012-09-11 LAB — CBC WITH DIFFERENTIAL/PLATELET
Basophil #: 0.1 10*3/uL (ref 0.0–0.1)
Basophil %: 1.1 %
Eosinophil %: 2.4 %
HGB: 10.7 g/dL — ABNORMAL LOW (ref 12.0–16.0)
Lymphocyte %: 20.9 %
MCH: 33.2 pg (ref 26.0–34.0)
MCHC: 36.8 g/dL — ABNORMAL HIGH (ref 32.0–36.0)
MCV: 90 fL (ref 80–100)
Monocyte #: 0.5 x10 3/mm (ref 0.2–0.9)
Monocyte %: 7.4 %
Neutrophil %: 68.2 %
WBC: 7.4 10*3/uL (ref 3.6–11.0)

## 2012-09-11 LAB — BASIC METABOLIC PANEL
Anion Gap: 9 (ref 7–16)
BUN: 28 mg/dL — ABNORMAL HIGH (ref 7–18)
Calcium, Total: 8.6 mg/dL (ref 8.5–10.1)
Creatinine: 0.76 mg/dL (ref 0.60–1.30)
EGFR (Non-African Amer.): >60
Glucose: 162 mg/dL — ABNORMAL HIGH (ref 65–99)
Osmolality: 242 (ref 275–301)
Potassium: 3.7 mmol/L (ref 3.5–5.1)
Sodium: 115 mmol/L — CL (ref 136–145)

## 2012-09-11 LAB — SODIUM: Sodium: 115 mmol/L — CL (ref 136–145)

## 2012-09-11 LAB — OSMOLALITY, URINE: Osmolality: 244 mOsm/kg

## 2012-09-12 LAB — BASIC METABOLIC PANEL
Co2: 25 mmol/L (ref 21–32)
Creatinine: 0.64 mg/dL (ref 0.60–1.30)
EGFR (African American): >60
Glucose: 208 mg/dL — ABNORMAL HIGH (ref 65–99)
Osmolality: 255 (ref 275–301)
Sodium: 122 mmol/L — ABNORMAL LOW (ref 136–145)

## 2012-09-12 LAB — CBC WITH DIFFERENTIAL/PLATELET
Basophil #: 0.1 10*3/uL (ref 0.0–0.1)
Eosinophil %: 0.7 %
Lymphocyte %: 15.5 %
MCH: 32.8 pg (ref 26.0–34.0)
MCV: 89 fL (ref 80–100)
Monocyte %: 7.1 %
Neutrophil %: 75.8 %
Platelet: 146 10*3/uL — ABNORMAL LOW (ref 150–440)

## 2012-09-12 LAB — SODIUM
Sodium: 124 mmol/L — ABNORMAL LOW (ref 136–145)
Sodium: 125 mmol/L — ABNORMAL LOW (ref 136–145)

## 2012-09-13 LAB — BASIC METABOLIC PANEL
Chloride: 95 mmol/L — ABNORMAL LOW (ref 98–107)
Co2: 23 mmol/L (ref 21–32)
EGFR (African American): >60
EGFR (Non-African Amer.): >60
Glucose: 200 mg/dL — ABNORMAL HIGH (ref 65–99)
Osmolality: 264 (ref 275–301)
Sodium: 128 mmol/L — ABNORMAL LOW (ref 136–145)

## 2012-09-13 LAB — PROTEIN ELECTROPHORESIS(ARMC)

## 2012-09-14 LAB — CBC WITH DIFFERENTIAL/PLATELET
Eosinophil %: 1.8 %
MCHC: 36.1 g/dL — ABNORMAL HIGH (ref 32.0–36.0)
Monocyte %: 7.5 %
Neutrophil #: 6.2 10*3/uL (ref 1.4–6.5)
Neutrophil %: 74.6 %

## 2012-09-14 LAB — BASIC METABOLIC PANEL
Chloride: 100 mmol/L (ref 98–107)
Co2: 27 mmol/L (ref 21–32)
EGFR (African American): >60
EGFR (Non-African Amer.): >60
Potassium: 3.4 mmol/L — ABNORMAL LOW (ref 3.5–5.1)

## 2012-09-15 LAB — BASIC METABOLIC PANEL
Chloride: 107 mmol/L (ref 98–107)
Co2: 27 mmol/L (ref 21–32)
Creatinine: 0.6 mg/dL (ref 0.60–1.30)
EGFR (Non-African Amer.): >60
Glucose: 170 mg/dL — ABNORMAL HIGH (ref 65–99)
Osmolality: 286 (ref 275–301)
Potassium: 3.4 mmol/L — ABNORMAL LOW (ref 3.5–5.1)
Sodium: 141 mmol/L (ref 136–145)

## 2012-09-15 LAB — LIPID PANEL
Cholesterol: 130 mg/dL (ref 0–200)
HDL Cholesterol: 55 mg/dL (ref 40–60)
VLDL Cholesterol, Calc: 12 mg/dL (ref 5–40)

## 2012-09-17 LAB — BASIC METABOLIC PANEL
Anion Gap: 5 — ABNORMAL LOW (ref 7–16)
BUN: 24 mg/dL — ABNORMAL HIGH (ref 7–18)
Creatinine: 0.83 mg/dL (ref 0.60–1.30)
EGFR (African American): >60
EGFR (Non-African Amer.): >60
Glucose: 223 mg/dL — ABNORMAL HIGH (ref 65–99)
Osmolality: 311 (ref 275–301)
Potassium: 3.6 mmol/L (ref 3.5–5.1)
Sodium: 151 mmol/L — ABNORMAL HIGH (ref 136–145)

## 2012-09-17 LAB — MAGNESIUM: Magnesium: 2.1 mg/dL

## 2012-10-16 DEATH — deceased

## 2013-01-06 IMAGING — RF DG FEMUR 2+V*R*
1 series · 4 of 4 positions shown · non-contrast
Comparison: Preoperative study from 9042 hours same day.

CLINICAL DATA: 82-year-old female undergoing right hip fixation.

RIGHT FEMUR - 2 VIEW

[Series 1: run · 4 of 4 slices shown]
[im 1/4]
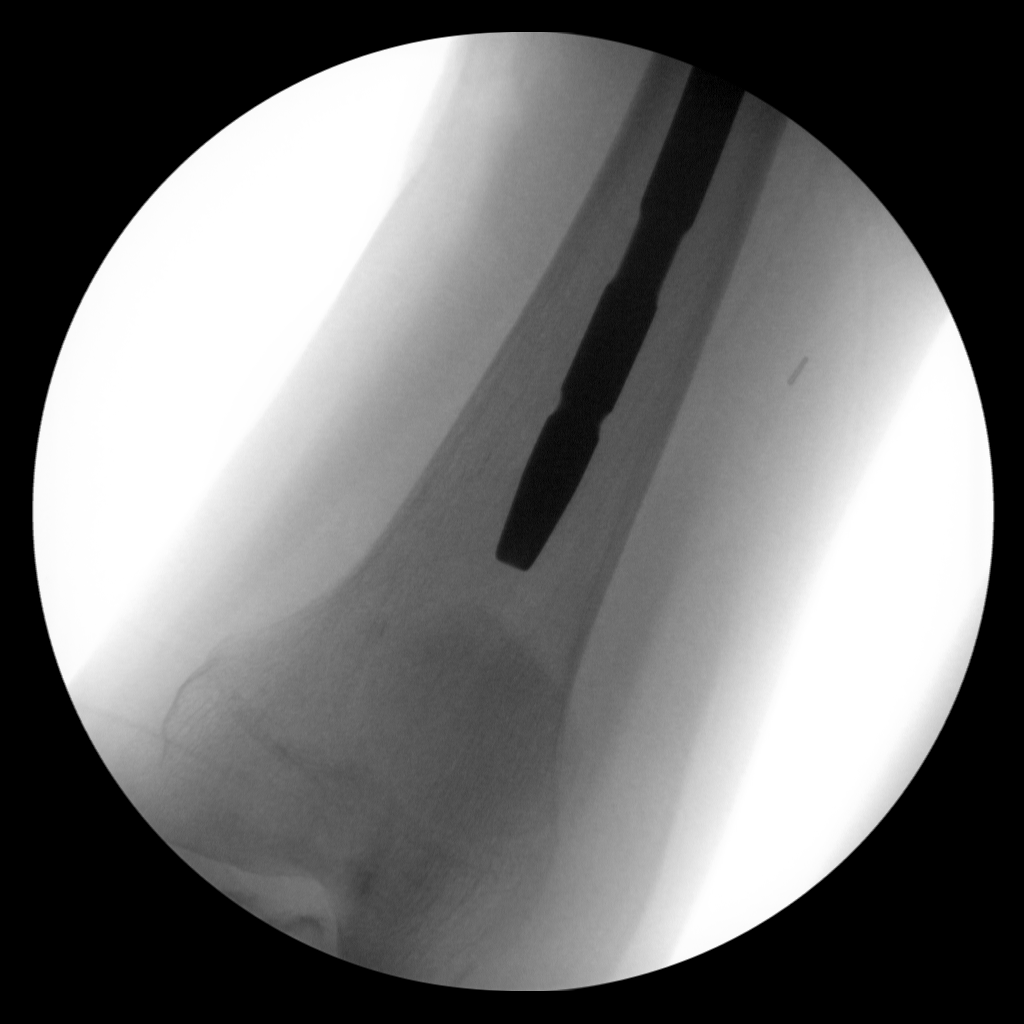
[im 2/4]
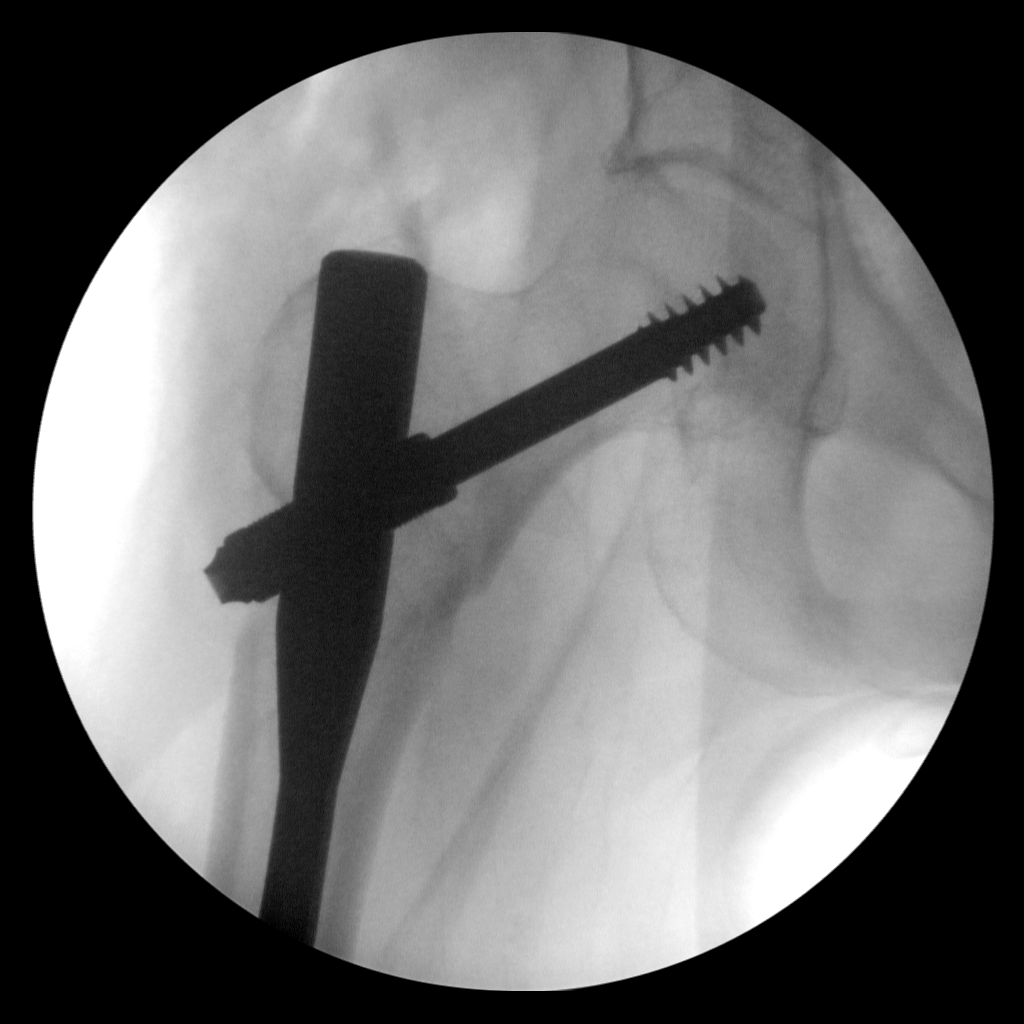
[im 3/4]
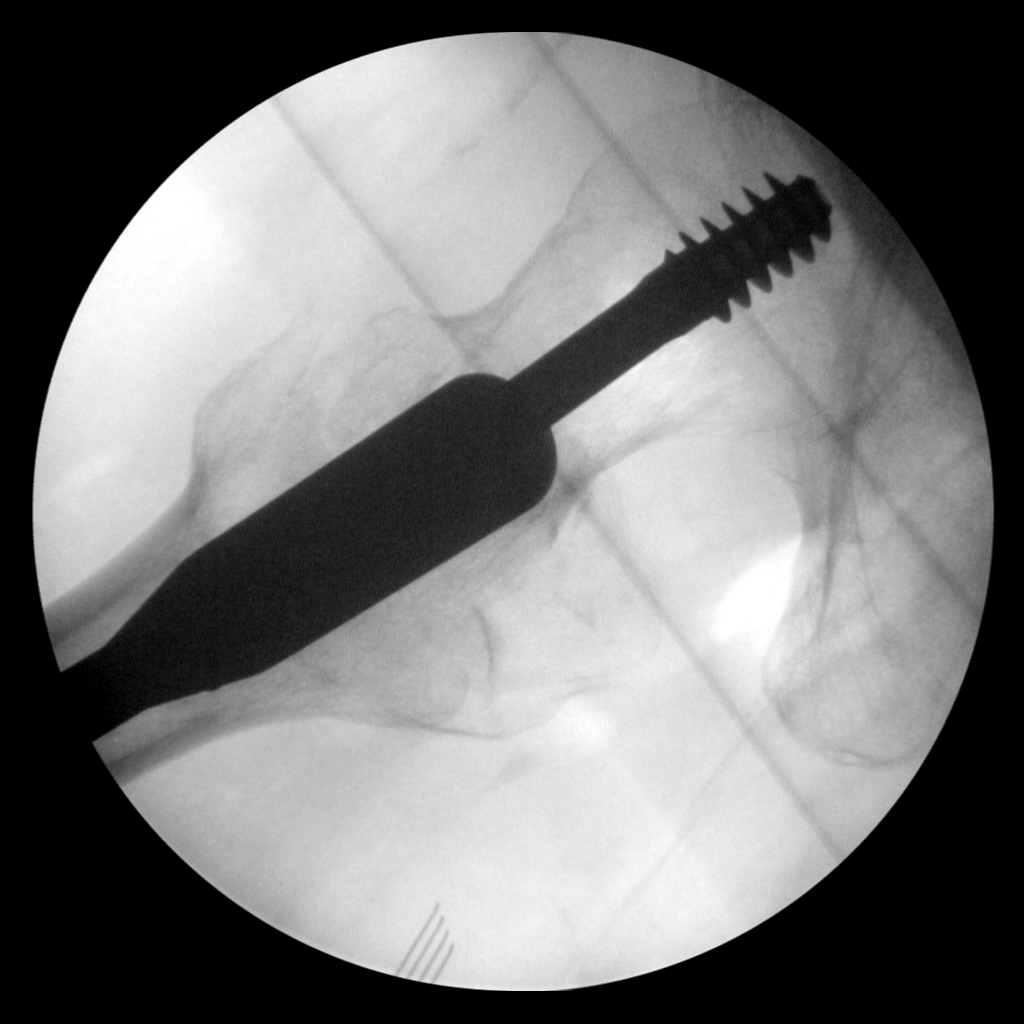
[im 4/4]
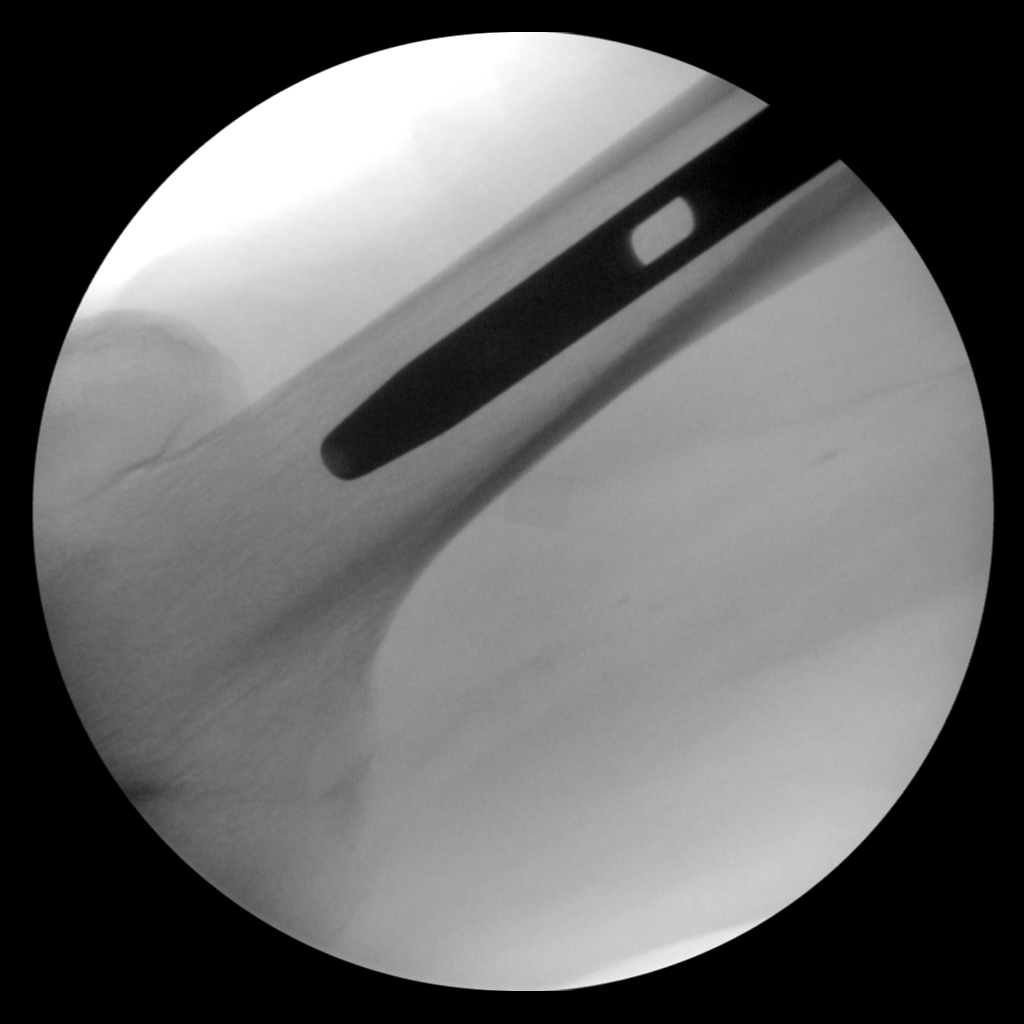

[4 of 4 positions shown; findings below may reference images not displayed]

FINDINGS: Four intraoperative fluoroscopic views of the right hip.
Intramedullary rod has been placed with proximal interlocking
dynamic hip screw traversing the comminuted intertrochanteric
fracture.  Hardware appears intact.  Significantly improved
alignment, near anatomic.
IMPRESSION: ORIF right femur without adverse features.

## 2013-01-08 IMAGING — CR DG CHEST 1V
1 series · 1 of 1 positions shown · non-contrast
Comparison: 02/23/2011

CLINICAL DATA: Fever

CHEST - 1 VIEW

[lateral]
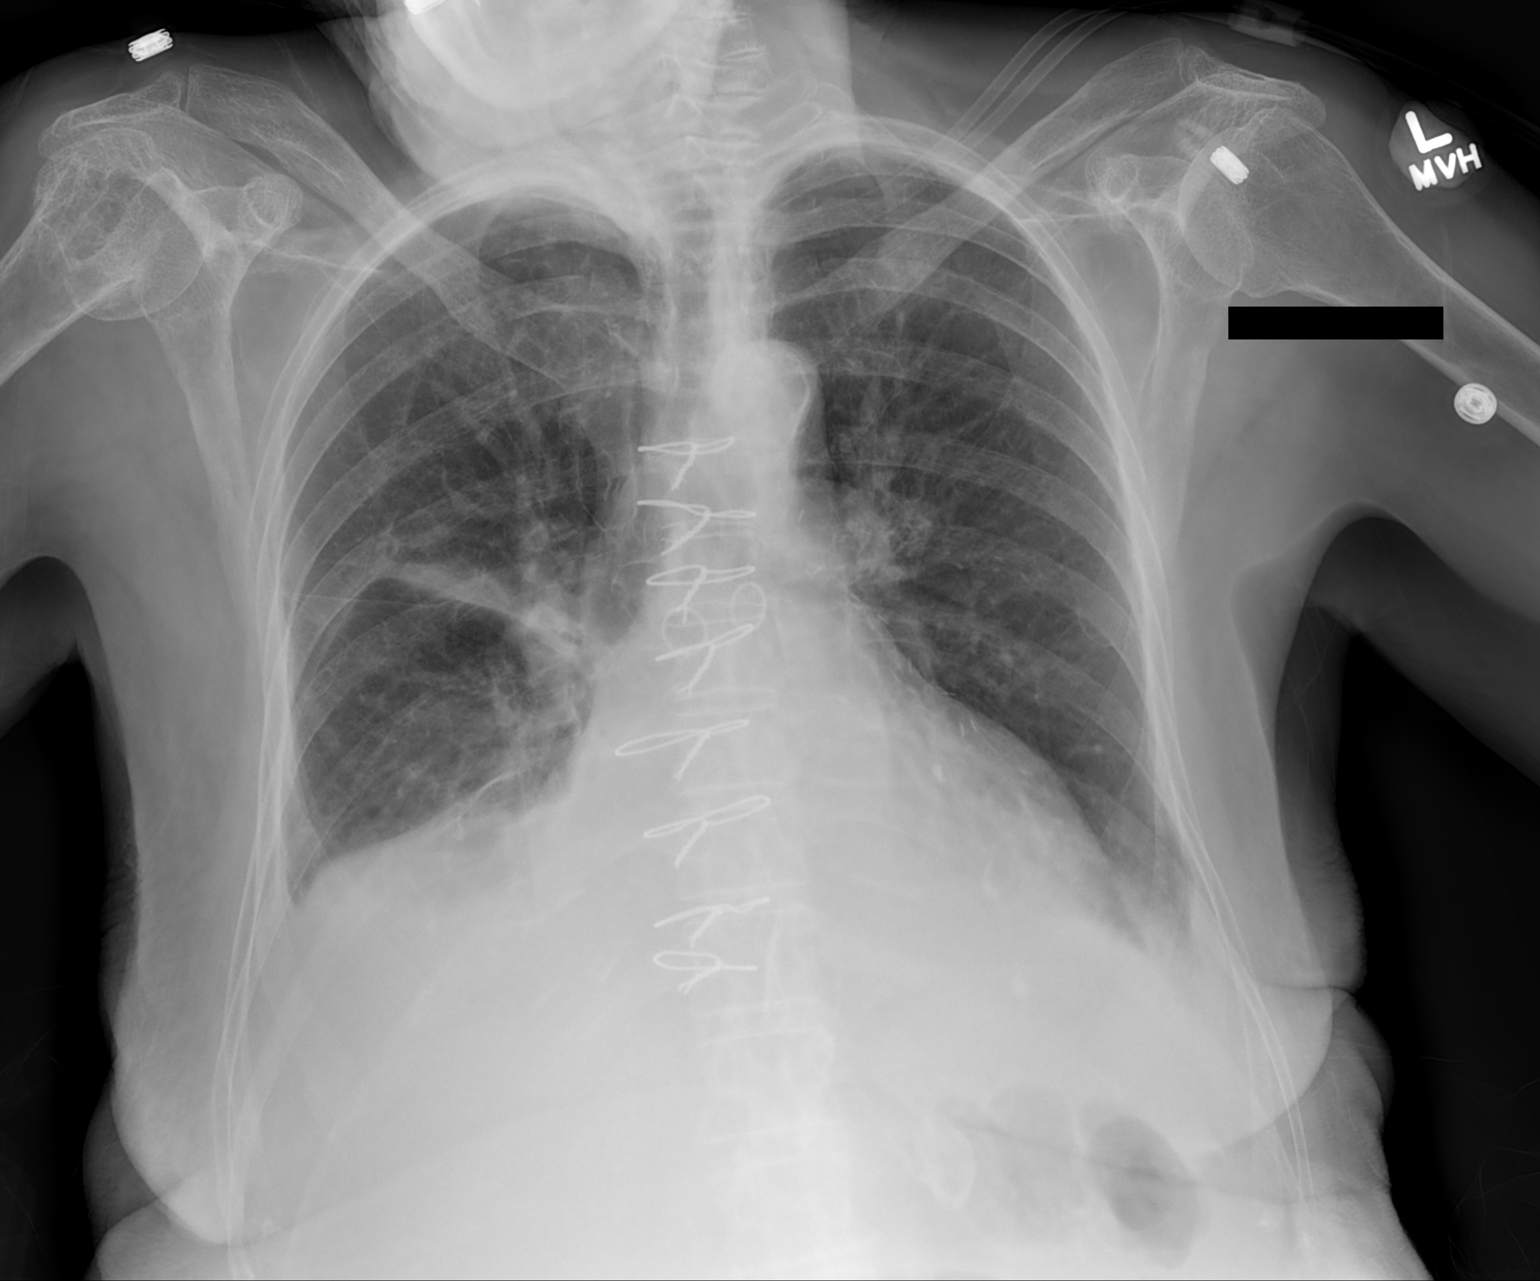

[1 of 1 positions shown; findings below may reference images not displayed]

FINDINGS: Mild patchy opacity in the right mid lung, grossly
unchanged suspicious for pneumonia.  Suspected small left pleural
effusion with associated new left lower lobe opacity, atelectasis
versus pneumonia.

The heart is top normal in size. Postsurgical changes related to
prior CABG.
IMPRESSION: Mild patchy opacity in the right mid lung, suspicious for
pneumonia.

Atelectasis versus pneumonia in the left lower lobe with associated
small left pleural effusion.

## 2014-05-08 NOTE — Discharge Summary (Signed)
PATIENT NAME:  Courtney SodaCHRISMON, Dashonda E MR#:  [id number] DATE OF BIRTH:  10-02-1929  ADMITTING DIAGNOSIS: Altered mental status.   DISCHARGE DIAGNOSES:  1.  Acute encephalopathy, felt to be metabolic as well as a result of a cerebrovascular accident.  2.  Acute cerebrovascular accident with right-sided weakness, with left-sided carotid stenosis.  3.  Severe hyponatremia.  4.  Partially treated urinary tract infection.  5.  Recurrent fall.  6.  Diabetes.  7.  Accelerated hypertension, tachycardia.  8.  Hypothyroidism.  9.  Seizure disorder.   PERTINENT LABS AND EVALUATIONS: Chest x-ray showed mild hyperinflation consistent with COPD. CT scan of the head showed chronic involutional changes without evidence of acute abnormality. EKG showed sinus rhythm with 1st-degree AV block. Osmolarity was 257. Dilantin 12.6. TSH 2.64. Glucose 152, BUN 26, creatinine 0.94, sodium 116, potassium 4.2, chloride 79. CO2 was 30, calcium 9.0. Liver function tests were normal. WBC count was 7.1. Hemoglobin was 12.4. Platelet count 141. Urine culture showed 1000 gram-negative rods. Random sodium in the urine was 50. Sodium on the 27th was 115. Cortisol was elevated.   Ultrasound of the carotids showed 75% blockage of the right and 50% on the left.   CT angiogram showed plaque on the left, best estimated to 60% to 70% on the left and 50% on the right.   Echocardiogram showed elevated pulmonary pressures. No clot.   HOSPITAL COURSE: The patient was admitted on 09/10/2012 and came in with recurrent falls, acute hyponatremia. The patient also had a laceration on her head. The patient had a UTI that was treated as an outpatient. The patient was brought to the ED with altered mental status and was noticed to have very low sodium of 116. She was on hydrochlorothiazide which was stopped. She was given IV fluids. There was also concern for  partially treated UTI. The patient, despite being treated for these conditions, continued to have  acute encephalopathy; therefore, she underwent an MRI of the brain which did show a left MCA distribution CVA, and so her mental status was felt to be due to a combination of hyponatremia as well as a CVA. The patient continued to not eat much and did not show much improvement. Therefore, a palliative care consult was obtained, and it was discussed with the family regarding further treatment and prognosis.   It was decided on September 2 to make the patient be admitted to the hospice home and comfortable.   CODE STATUS: CHANGED TO DNR.   At this time, the patient is stable for discharge to hospice home.   DISCHARGE MEDICATIONS:  200 daily, citalopram 20 q. daily, acetaminophen 650 q.4 p.r.n., lorazepam 1 mg q.4 as needed for agitation, oxycodone 5 mg/5 mL every six hours as needed for pain.   TIME SPENT: Over 35 minutes spent on this discharge.   ____________________________ Lacie ScottsShreyang H. Allena KatzPatel, MD shp:np D: 09/18/2012 20:26:00 ET T: 09/18/2012 22:10:47 ET JOB#: [id number]  cc: Jsaon Yoo H. Allena KatzPatel, MD, <Dictator> Charise CarwinSHREYANG H Ming Mcmannis MD ELECTRONICALLY SIGNED 09/20/2012 15:12

## 2014-05-08 NOTE — H&P (Signed)
PATIENT NAME:  Courtney SodaCHRISMON, Dorean E MR#:  [id number] DATE OF BIRTH:  30-Jun-1929  DATE OF ADMISSION:  09/10/2012  PRIMARY CARE PHYSICIAN: Richard L. Sullivan LoneGilbert, MD  CHIEF COMPLAINT: Recurrent falls and acute hyponatremia.   HISTORY OF PRESENT ILLNESS: This is an 81 year old female who presented to the hospital with recurrent falls and noted to be acutely hyponatremic. The patient apparently was in the bathroom when she fell earlier today. She fell on the back of her head and was noted to have a laceration. The patient was seen by her primary care physician a few days back, thought she had a urinary tract infection and was started on oral ciprofloxacin. The patient had only taken a day of oral ciprofloxacin. She does admit to frequency of urination, but no dysuria and no hematuria. The patient presented to the hospital, was noted to be acutely hyponatremic with a sodium of 116. She was also noted to have a urinary tract infection. Hospitalist services were contacted for further treatment and evaluation.   REVIEW OF SYSTEMS:    CONSTITUTIONAL: There is no documented fever. No weight gain. No weight loss.  EYES: No blurred or double vision.  EARS, NOSE, THROAT: No tinnitus. No postnasal drip. No redness of the oropharynx.  RESPIRATORY: No cough, no wheeze, no hemoptysis, no dyspnea.  CARDIOVASCULAR: No chest pain. No orthopnea. No palpitations. No syncope.  GASTROINTESTINAL: No nausea, no vomiting, no diarrhea, no abdominal pain, no melena or hematochezia.  GENITOURINARY: No dysuria or hematuria.  ENDOCRINE: No polyuria or nocturia, heat or cold intolerance.  HEMATOLOGIC: No anemia, no bruising, no bleeding.  INTEGUMENTARY: No rashes. No lesions.  MUSCULOSKELETAL: No arthritis. No swelling. No gout.  NEUROLOGIC: No numbness, no tingling, no ataxia, no seizure-type activity. PSYCHIATRIC: No anxiety, no insomnia, no ADD.   PAST MEDICAL HISTORY: Consistent with hypertension, diabetes, GERD, history of  seizures, hypothyroidism, history of recurrent UTIs.   ALLERGIES: ASPIRIN AND SULFA DRUGS.   SOCIAL HISTORY: No smoking. No alcohol abuse. No illicit drug abuse. Lives at home with her husband.   FAMILY HISTORY: Both mother and father are deceased. They both died from complications of heart disease.   CURRENT MEDICATIONS: Amlodipine 5 mg b.i.d., calcium with vitamin D 1 tab daily, Coreg 12.5 mg b.i.d., ciprofloxacin 250 mg b.i.d., Celexa 20 mg daily, HCTZ 25 mg daily, Lantus 12 units at bedtime, metformin 1000 mg b.i.d., Nexium 40 mg daily, Macrobid 100 mg daily, Dilantin 100 mg 2 caps daily, Synthroid 50 mcg daily, torsemide 10 mg 1/2 tab to 1 tab daily.   PHYSICAL EXAMINATION: VITAL SIGNS: Presently noted to be: Temperature is 98.3, pulse 83, respirations 18, blood pressure 202/84, sats 99% on room air.  GENERAL: She is a pleasant-appearing female in no apparent distress.  HEAD, EYES, EARS, NOSE, THROAT: The patient does have a laceration to the back of her head, otherwise normocephalic. Her extraocular muscles are intact. Her pupils are equal and reactive eye to light. Her sclerae are anicteric. No conjunctival injection. No pharyngeal erythema.  NECK: Supple. There is no jugular venous distention. No bruits, no lymphadenopathy, no thyromegaly.  HEART: Regular rate and rhythm. No murmurs. No rubs. No clicks.  LUNGS: Clear to auscultation bilaterally. No rales, no rhonchi, no wheezes.  ABDOMEN: Soft, flat, nontender, nondistended. Has good bowel sounds. No hepatosplenomegaly appreciated.  EXTREMITIES: No evidence of any cyanosis, clubbing or peripheral edema. Has +2 pedal and radial pulses bilaterally.  NEUROLOGICAL: She is alert, awake, oriented x 2. Globally weak. No other focal  motor or sensory deficits appreciated bilaterally.  SKIN: Moist and warm. She does have skin tears on her upper extremities bilaterally from her recurrent falls.  LYMPHATIC: There is no cervical or axillary  lymphadenopathy.   LABORATORY AND RADIOLOGICAL DATA: Serum glucose of 152, BUN 26, creatinine 0.9, sodium 116, potassium 4.2, chloride 79, bicarbonate 30. LFTs are within normal limits. White cell count 7.1, hemoglobin 12.4, hematocrit 34, platelet count 141. The patient did have a CT of the head done without contrast, which showed chronic involutional changes without evidence of acute abnormalities. The patient also had a chest x-ray done, which showed mild hyperinflation consistent with COPD.   ASSESSMENT AND PLAN: This is an 81 year old female with a history of diabetes, hypertension, history of recurrent urinary tract infections, hypothyroidism, history of seizures, depression, who presented to the hospital due to recurrent falls and noted to be acutely hyponatremic and also to have a urinary tract infection.  1.  Acute hyponatremia. The exact etiology of the hyponatremia is unclear, but suspicious for hypotonic hypovolemic hyponatremia. The patient apparently does not drink enough fluids and is on diuretics, including HCTZ and torsemide. I will hold her diuretics for now. I will start her on gentle IV fluid hydration and follow sodium. I will check a urine sodium, urine osmolality and serum osmolality.  2.  Urinary tract infection: I will treat her with IV ceftriaxone and follow urine cultures. 3.  Recurrent falls: Questionable if this is related to her hyponatremia or probable deconditioning. I will get a physical therapy consult to assess her mobility.  4.  Diabetes: Continue Lantus and sliding scale insulin. Hold metformin for now. 5.  Hypothyroidism: Continue Synthroid.  6.  History of seizures: Continue Dilantin.  7.  Depression: Continue Celexa. 8.  Malignant hypertension: The patient's blood pressures are significantly elevated. I will hold her diuretics for now given her hyponatremia. I will continue her Coreg I will add some p.r.n. hydralazine for systolic blood pressure greater than 180.    CODE STATUS: The patient is a full code.   TIME SPENT: 50 minutes.    ____________________________ Rolly Pancake. Cherlynn Kaiser, MD [id number] D: 09/10/2012 16:25:34 ET T: 09/10/2012 17:05:56 ET JOB#: [id number]  cc: Rolly Pancake. Cherlynn Kaiser, MD, <Dictator> Houston Siren MD ELECTRONICALLY SIGNED 09/28/2012 15:09

## 2014-05-08 NOTE — Consult Note (Signed)
Comments   Christin Gusler, NP, and I met with pt's husband, daughter, and son. Family updated. They all understand that she has had a stroke and feel that she is declining. Pt unable to work with PT. Seems to have waxing and waning mental status and is eating some food. We talked about options for her disposition including STR vs LTC vs hospice involvement. [facility] would [facility]. Will ask hospice liaison to meet with family to clarify details.  code status. Children feel strongly that patient should be a DNR. Husband had some reservations but ultimately said that pt has a living will and would not want resuscitation. He is in agreement with DNR. Portable form signed and placed on chart.  30 minutes  Electronic Signatures: Borders, Joshua R (NP)  (Signed 02-Sep-14 15:41)  Authored: Palliative Care   Last Updated: 02-Sep-14 15:41 by Borders, Joshua R (NP) 

## 2014-07-29 IMAGING — CT CT ANGIOGRAPHY NECK
1 of 4 series · 12 of 33 positions shown · IV contrast (APPLIED)
Comparison: none

REASON FOR EXAM: cva
COMMENTS:

[Series 4: soft tissue · axial · 0.39mm/px · z∈[-274,-56]mm · 12 of 87 slices shown]
[im 7/87  soft-tissue]
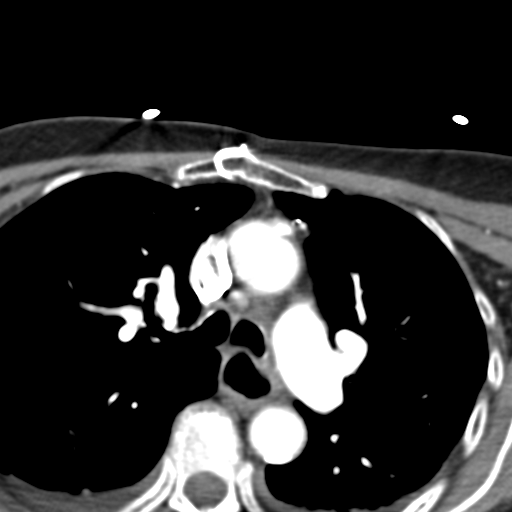
[im 14/87  bone]
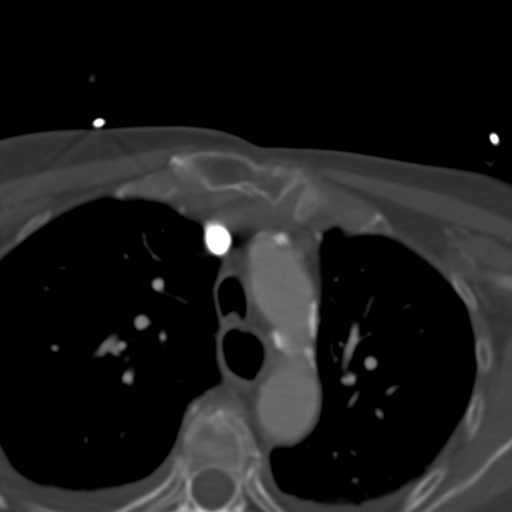
[im 20/87  soft-tissue]
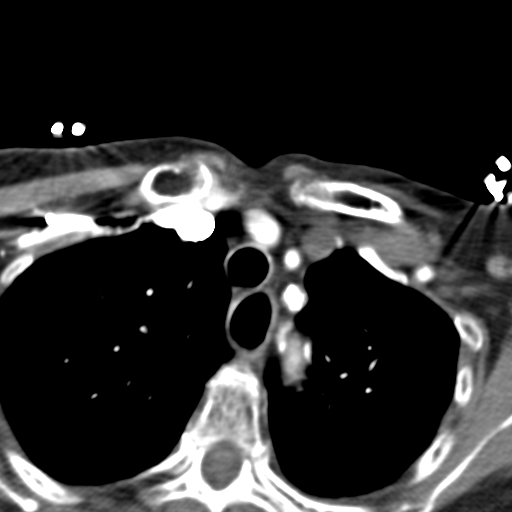
[im 27/87  bone]
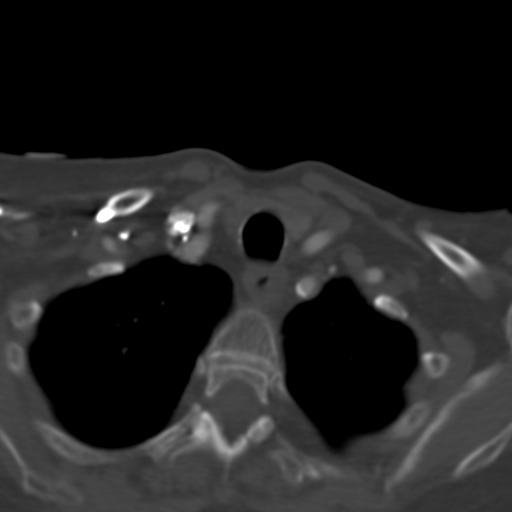
[im 34/87  soft-tissue]
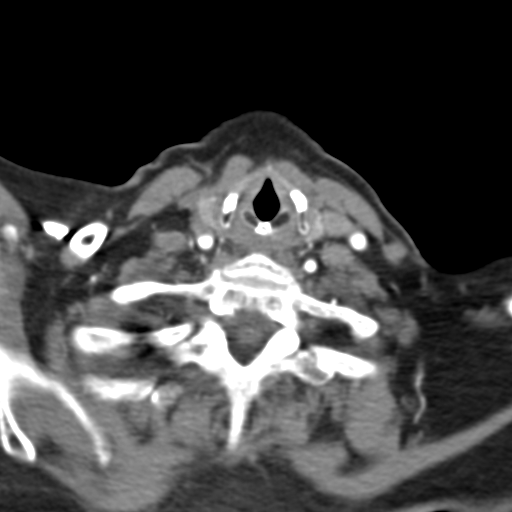
[im 40/87  bone]
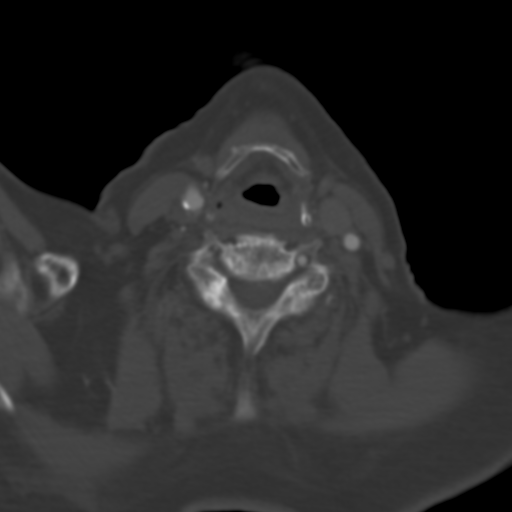
[im 47/87  soft-tissue]
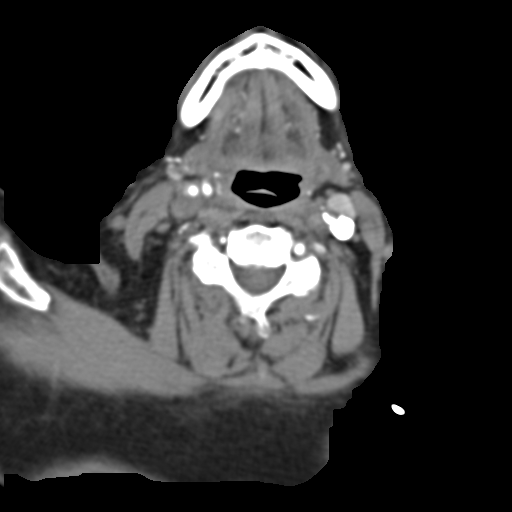
[im 53/87  bone]
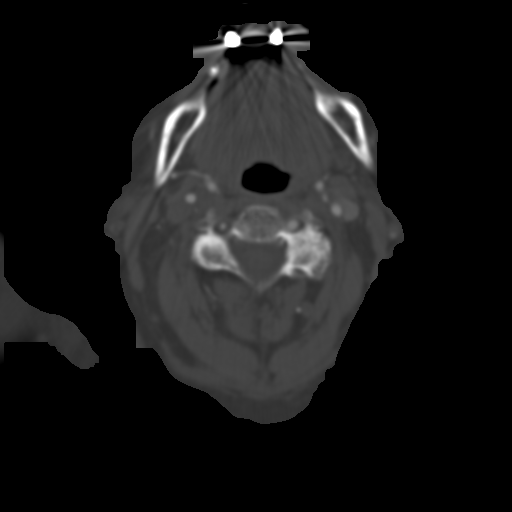
[im 60/87  soft-tissue]
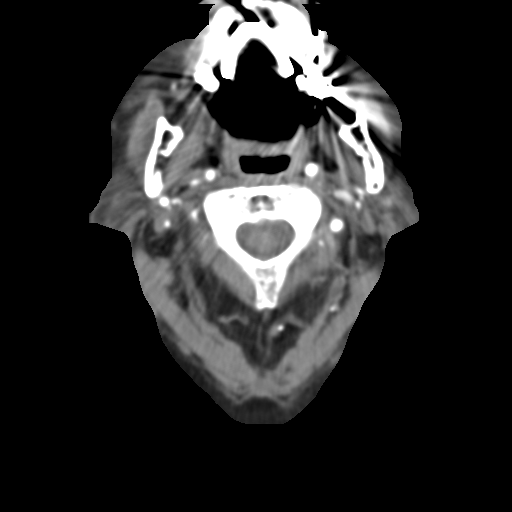
[im 67/87  bone]
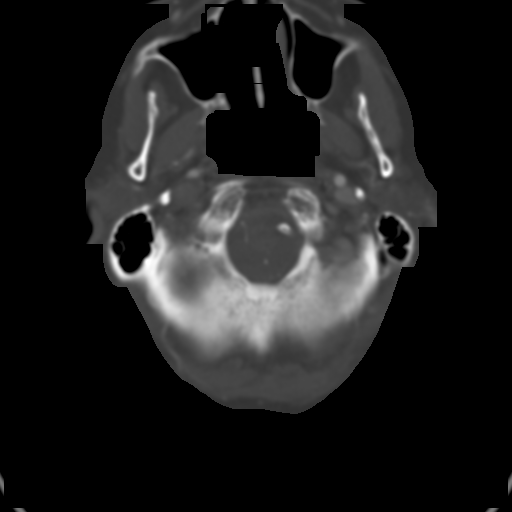
[im 73/87  soft-tissue]
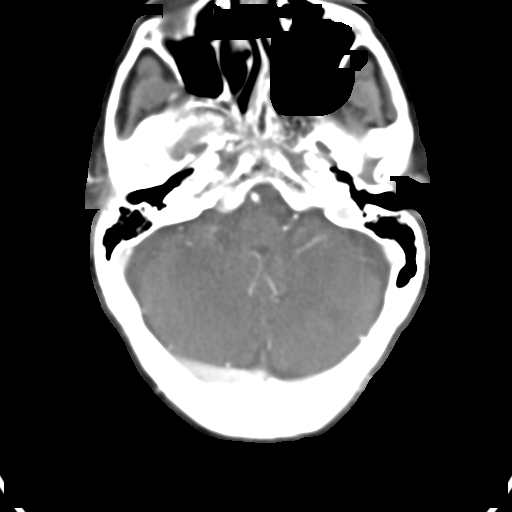
[im 80/87  bone]
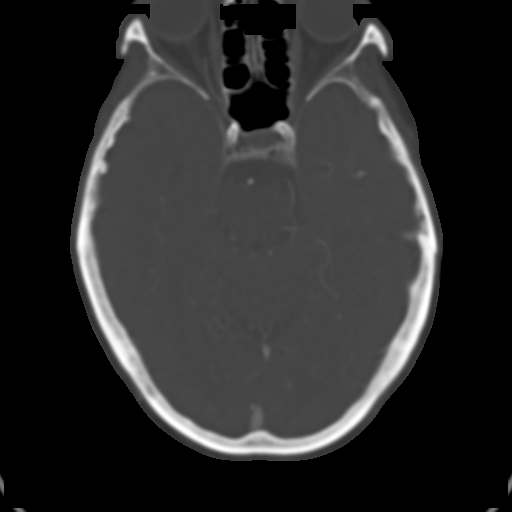

[12 of 33 positions shown; findings below may reference images not displayed]

PROCEDURE:     CT  - CT ANGIOGRAPHY NECK W/CONTRAST  - September 14, 2012  [DATE]

RESULT:     CT angiography was performed through the neck. Comparison is
made to a previous study from July 2010 which demonstrated significant
disease in the left carotid bulb and proximal internal carotid artery.

On the left there is considerable calcification at the level of the carotid
bulb. This limits the sensitivity of the study. Contrast is seen in the
proximal internal carotid artery extending into the base of the brain. The
degree of stenosis cannot be well evaluated due to adjacent calcification.
On the right there is calcification at the bulb with good filling of the
internal carotid artery. The visualized portions of the intracranial
circulation reveals no acute abnormality.
IMPRESSION: 1. There is significant calcified plaque at the carotid bulbs especially on
the left. There is flow demonstrated distally. Given the large amount of
plaque present. It is difficult to accurately assess the degree of stenosis
present but the best estimate would be between 60 and 70% on the left and
approximately 50% on the right. No acute thrombus is demonstrated.
2. The left vertebral artery is dominant. The right vertebral artery while
present is much smaller in caliber than is the left.

[REDACTED]
